# Patient Record
Sex: Female | Born: 1975
Health system: Southern US, Community
[De-identification: ages and names within clinical notes are randomized; demographics above are authoritative.]

## PROBLEM LIST (undated history)

## (undated) DIAGNOSIS — G43909 Migraine, unspecified, not intractable, without status migrainosus: Secondary | ICD-10-CM

## (undated) DIAGNOSIS — Z8619 Personal history of other infectious and parasitic diseases: Secondary | ICD-10-CM

## (undated) HISTORY — DX: Migraine, unspecified, not intractable, without status migrainosus: G43.909

## (undated) HISTORY — PX: NO PAST SURGERIES: SHX2092

## (undated) HISTORY — DX: Personal history of other infectious and parasitic diseases: Z86.19

## (undated) HISTORY — PX: AUGMENTATION MAMMAPLASTY: SUR837

---

## 1998-07-11 ENCOUNTER — Other Ambulatory Visit: Admission: RE | Admit: 1998-07-11 | Discharge: 1998-07-11 | Payer: Self-pay | Admitting: Obstetrics and Gynecology

## 1999-07-14 ENCOUNTER — Other Ambulatory Visit: Admission: RE | Admit: 1999-07-14 | Discharge: 1999-07-14 | Payer: Self-pay | Admitting: Obstetrics and Gynecology

## 2000-12-17 ENCOUNTER — Other Ambulatory Visit: Admission: RE | Admit: 2000-12-17 | Discharge: 2000-12-17 | Payer: Self-pay | Admitting: Obstetrics and Gynecology

## 2001-01-31 ENCOUNTER — Encounter: Payer: Self-pay | Admitting: Obstetrics and Gynecology

## 2001-01-31 ENCOUNTER — Ambulatory Visit (HOSPITAL_COMMUNITY): Admission: RE | Admit: 2001-01-31 | Discharge: 2001-01-31 | Payer: Self-pay | Admitting: Obstetrics and Gynecology

## 2001-05-31 ENCOUNTER — Inpatient Hospital Stay (HOSPITAL_COMMUNITY): Admission: AD | Admit: 2001-05-31 | Discharge: 2001-05-31 | Payer: Self-pay | Admitting: *Deleted

## 2001-06-18 ENCOUNTER — Inpatient Hospital Stay (HOSPITAL_COMMUNITY): Admission: AD | Admit: 2001-06-18 | Discharge: 2001-06-21 | Payer: Self-pay | Admitting: Obstetrics and Gynecology

## 2001-06-24 ENCOUNTER — Encounter: Admission: RE | Admit: 2001-06-24 | Discharge: 2001-07-24 | Payer: Self-pay | Admitting: Obstetrics and Gynecology

## 2001-07-25 ENCOUNTER — Encounter: Admission: RE | Admit: 2001-07-25 | Discharge: 2001-08-24 | Payer: Self-pay | Admitting: Obstetrics and Gynecology

## 2003-07-24 ENCOUNTER — Emergency Department (HOSPITAL_COMMUNITY): Admission: EM | Admit: 2003-07-24 | Discharge: 2003-07-24 | Payer: Self-pay | Admitting: Emergency Medicine

## 2004-06-29 ENCOUNTER — Other Ambulatory Visit: Admission: RE | Admit: 2004-06-29 | Discharge: 2004-06-29 | Payer: Self-pay | Admitting: Obstetrics and Gynecology

## 2005-05-22 ENCOUNTER — Ambulatory Visit: Payer: Self-pay | Admitting: Family Medicine

## 2005-07-11 ENCOUNTER — Other Ambulatory Visit: Admission: RE | Admit: 2005-07-11 | Discharge: 2005-07-11 | Payer: Self-pay | Admitting: Obstetrics and Gynecology

## 2006-07-10 ENCOUNTER — Ambulatory Visit: Payer: Self-pay | Admitting: Family Medicine

## 2008-06-23 ENCOUNTER — Ambulatory Visit: Payer: Self-pay | Admitting: Family Medicine

## 2009-05-17 ENCOUNTER — Telehealth (INDEPENDENT_AMBULATORY_CARE_PROVIDER_SITE_OTHER): Payer: Self-pay | Admitting: *Deleted

## 2009-10-06 ENCOUNTER — Ambulatory Visit: Payer: Self-pay | Admitting: Family Medicine

## 2009-10-06 DIAGNOSIS — J069 Acute upper respiratory infection, unspecified: Secondary | ICD-10-CM

## 2009-10-06 HISTORY — DX: Acute upper respiratory infection, unspecified: J06.9

## 2009-10-06 LAB — CONVERTED CEMR LAB: Rapid Strep: NEGATIVE

## 2009-12-30 ENCOUNTER — Telehealth (INDEPENDENT_AMBULATORY_CARE_PROVIDER_SITE_OTHER): Payer: Self-pay | Admitting: *Deleted

## 2009-12-30 ENCOUNTER — Ambulatory Visit: Payer: Self-pay | Admitting: Family Medicine

## 2009-12-30 DIAGNOSIS — R079 Chest pain, unspecified: Secondary | ICD-10-CM

## 2009-12-30 HISTORY — DX: Chest pain, unspecified: R07.9

## 2010-06-15 ENCOUNTER — Telehealth (INDEPENDENT_AMBULATORY_CARE_PROVIDER_SITE_OTHER): Payer: Self-pay | Admitting: *Deleted

## 2010-07-02 LAB — CONVERTED CEMR LAB
ALT: 13 units/L (ref 0–35)
AST: 17 units/L (ref 0–37)
Albumin: 4.2 g/dL (ref 3.5–5.2)
Alkaline Phosphatase: 55 units/L (ref 39–117)
BUN: 13 mg/dL (ref 6–23)
Basophils Absolute: 0 10*3/uL (ref 0.0–0.1)
Basophils Relative: 0.3 % (ref 0.0–3.0)
Bilirubin, Direct: 0.2 mg/dL (ref 0.0–0.3)
CK-MB: 0.7 ng/mL (ref 0.3–4.0)
CO2: 26 meq/L (ref 19–32)
Calcium: 9 mg/dL (ref 8.4–10.5)
Chloride: 103 meq/L (ref 96–112)
Cholesterol: 153 mg/dL (ref 0–200)
Creatinine, Ser: 0.7 mg/dL (ref 0.4–1.2)
Eosinophils Absolute: 0.1 10*3/uL (ref 0.0–0.7)
Eosinophils Relative: 0.8 % (ref 0.0–5.0)
GFR calc non Af Amer: 111.61 mL/min (ref >60)
Glucose, Bld: 75 mg/dL (ref 70–99)
HCT: 40.7 % (ref 36.0–46.0)
HDL: 44.8 mg/dL (ref >39.00)
Hemoglobin: 13.8 g/dL (ref 12.0–15.0)
LDL Cholesterol: 101 mg/dL — ABNORMAL HIGH (ref 0–99)
Lymphocytes Relative: 26 % (ref 12.0–46.0)
Lymphs Abs: 1.9 10*3/uL (ref 0.7–4.0)
MCHC: 33.9 g/dL (ref 30.0–36.0)
MCV: 94.6 fL (ref 78.0–100.0)
Monocytes Absolute: 0.5 10*3/uL (ref 0.1–1.0)
Monocytes Relative: 6.7 % (ref 3.0–12.0)
Neutro Abs: 4.9 10*3/uL (ref 1.4–7.7)
Neutrophils Relative %: 66.2 % (ref 43.0–77.0)
Platelets: 205 10*3/uL (ref 150.0–400.0)
Potassium: 4.2 meq/L (ref 3.5–5.1)
RBC: 4.3 M/uL (ref 3.87–5.11)
RDW: 13.8 % (ref 11.5–14.6)
Sed Rate: 7 mm/hr (ref 0–22)
Sodium: 134 meq/L — ABNORMAL LOW (ref 135–145)
TSH: 0.52 microintl units/mL (ref 0.35–5.50)
Total Bilirubin: 1.3 mg/dL — ABNORMAL HIGH (ref 0.3–1.2)
Total CHOL/HDL Ratio: 3
Total CK: 84 units/L (ref 7–177)
Total Protein: 7 g/dL (ref 6.0–8.3)
Triglycerides: 36 mg/dL (ref 0.0–149.0)
Troponin I: <0.01 ng/mL (ref <0.06)
VLDL: 7.2 mg/dL (ref 0.0–40.0)
WBC: 7.4 10*3/uL (ref 4.5–10.5)

## 2010-07-04 NOTE — Assessment & Plan Note (Signed)
Summary: CPX/FASTING//KN   Vital Signs:  Patient profile:   35 year old female Height:      63.50 inches Weight:      111 pounds BMI:     19.42 Pulse rate:   98 / minute BP sitting:   104 / 78  (left arm)  Vitals Entered By: Doristine Devoid CMA (December 30, 2009 10:20 AM) CC: CPX AND LABS- along w/ some joint pain    History of Present Illness: 35 yo woman here today for CPE.  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  chest pain- L sided, occured yesterday at work.  pain radiated up into neck and down L arm.  pain described as a dull ache.  intermittant, upper arm still aching.  no nausea.  + diaphoresis.  no family hx of heart disease.  recent increased stress- father dying of melanoma  Preventive Screening-Counseling & Management  Alcohol-Tobacco     Alcohol drinks/day: 0     Smoking Status: never  Caffeine-Diet-Exercise     Does Patient Exercise: yes     Type of exercise: is active w/ kids      Sexual History:  currently monogamous.        Drug Use:  never.    Current Medications (verified): 1)  Naproxen 500 Mg Tabs (Naproxen) .Marland Kitchen.. 1 Tab By Mouth Two Times A Day X7-10 Days and Then As Needed For Pain.  Take W/ Food. 2)  Cyclobenzaprine Hcl 10 Mg  Tabs (Cyclobenzaprine Hcl) .Marland Kitchen.. 1 By Mouth 2 Times Daily As Needed For Back Pain  Allergies (verified): 1)  ! Sulfa  Past History:  Past Medical History: none  Family History: Reviewed history from 06/23/2008 and no changes required. CAD-no HTN-no DM-no STROKE-no COLONCA- no BREAST CA-no metastatic melanoma- dad  Social History: married 2 children works at [employer] Status:  never Does Patient Exercise:  yes Sexual History:  currently monogamous Drug Use:  never  Review of Systems       The patient complains of anorexia and chest pain.  The patient denies fever, weight loss, weight gain, vision loss, decreased hearing, hoarseness, syncope, dyspnea on exertion, peripheral edema, prolonged cough, headaches, abdominal pain,  melena, hematochezia, severe indigestion/heartburn, hematuria, suspicious skin lesions, depression, abnormal bleeding, enlarged lymph nodes, and breast masses.         anorexia due to stress  Physical Exam  General:  Well-developed,well-nourished,in no acute distress; alert,appropriate and cooperative throughout examination Head:  Normocephalic and atraumatic without obvious abnormalities. No apparent alopecia or balding.  Eyes:  No corneal or conjunctival inflammation noted. EOMI. Perrla. Funduscopic exam benign, without hemorrhages, exudates or papilledema. Vision grossly normal. Ears:  External ear exam shows no significant lesions or deformities.  Otoscopic examination reveals clear canals, tympanic membranes are intact bilaterally without bulging, retraction, inflammation or discharge. Hearing is grossly normal bilaterally. Nose:  External nasal examination shows no deformity or inflammation. Nasal mucosa are pink and moist without lesions or exudates. Mouth:  Oral mucosa and oropharynx without lesions or exudates.  Teeth in good repair. Neck:  + TTP over trapezius and SCM, + muscle spasm Chest Wall:  + TTP over L upper anterior wall Breasts:  deferred to GYN Lungs:  Normal respiratory effort, chest expands symmetrically. Lungs are clear to auscultation, no crackles or wheezes. Heart:  Normal rate and regular rhythm. S1 and S2 normal without gallop, murmur, click, rub or other extra sounds. Abdomen:  Bowel sounds positive,abdomen soft and non-tender without masses, organomegaly or hernias noted. Genitalia:  deferred  to GYN Msk:  see neck exam full ROM of L shoulder Pulses:  +2 carotid, radial, DP Extremities:  No clubbing, cyanosis, edema, or deformity noted with normal full range of motion of all joints.   Neurologic:  No cranial nerve deficits noted. Station and gait are normal. Plantar reflexes are down-going bilaterally. DTRs are symmetrical throughout. Sensory, motor and coordinative  functions appear intact. Skin:  Intact without suspicious lesions or rashes Cervical Nodes:  No lymphadenopathy noted Axillary Nodes:  No palpable lymphadenopathy Inguinal Nodes:  No significant adenopathy Psych:  anxious   Impression & Recommendations:  Problem # 1:  PHYSICAL EXAMINATION (ICD-V70.0) Assessment New PE WNL w/ exception of chest and neck pain (see below).  EKG WNL  check labs.  UTD on GYN exam.  anticipatory guidance provided.  Problem # 2:  CHEST PAIN (ICD-786.50) Assessment: New pt's pain most likely due to muscle spasm.  start NSAIDs, muscle relaxer.  pain reproducible on exam.  EKG w/out acute abnormality.  check CK, CKMB, Troponin to r/o ischemia.  reviewed supportive care and red flags that should prompt return.  Pt expresses understanding and is in agreement w/ this plan. Orders: Venipuncture (71696) TLB-TSH (Thyroid Stimulating Hormone) (84443-TSH) TLB-CBC Platelet - w/Differential (85025-CBCD) TLB-BMP (Basic Metabolic Panel-BMET) (80048-METABOL) TLB-Lipid Panel (80061-LIPID) TLB-Hepatic/Liver Function Pnl (80076-HEPATIC) TLB-CK-MB (Creatine Kinase MB) (82553-CKMB) TLB-CK Total Only(Creatine Kinase/CPK) (82550-CK) T- * Misc. Laboratory test [phone]) Specimen Handling (10175)  Problem # 3:  ARTHRITIS, RHEUMATOID, FAMILY HX (ICD-V17.7) Assessment: New pt would like labs checked given strong family hx. Orders: TLB-Sedimentation Rate (ESR) (85652-ESR) Specimen Handling (10258)  Complete Medication List: 1)  Naproxen 500 Mg Tabs (Naproxen) .Marland Kitchen.. 1 tab by mouth two times a day x7-10 days and then as needed for pain.  take w/ food. 2)  Cyclobenzaprine Hcl 10 Mg Tabs (Cyclobenzaprine hcl) .Marland Kitchen.. 1 by mouth 2 times daily as needed for back pain  Other Orders: EKG w/ Interpretation (93000)  Patient Instructions: 1)  Follow up in 1 month to check on mood and stress 2)  We'll notify you of your lab results 3)  Take the Naproxen two times a day as directed- take w/  food to avoid upset stomach 4)  Use the cyclobenzaprine (muscle relaxer) at night- this will cause drowsiness 5)  Use a heating pad on your neck muscles 6)  Try and find an outlet for your stress 7)  Be aware of your posture 8)  Call with any questions or concerns 9)  Hang in there! Prescriptions: CYCLOBENZAPRINE HCL 10 MG  TABS (CYCLOBENZAPRINE HCL) 1 by mouth 2 times daily as needed for back pain  #20 x 0   Entered and Authorized by:   Neena Rhymes MD   Signed by:   Neena Rhymes MD on 12/30/2009   Method used:   Electronically to        [facility]* (retail)       [address] [place], [place]  [address]       Ph: [phone] or [phone]       Fax: [phone]   RxID:   [id number] NAPROXEN 500 MG TABS (NAPROXEN) 1 tab by mouth two times a day x7-10 days and then as needed for pain.  take w/ food.  #60 x 0   Entered and Authorized by:   Neena Rhymes MD   Signed by:   Neena Rhymes  MD on 12/30/2009   Method used:   Electronically to        [facility]* (retail)       [address] [place], [place]  [address]       Ph: [phone] or [phone]       Fax: [phone]   RxID:   [id number]   Appended Document: CPX/FASTING//KN    Clinical Lists Changes  Observations: Added new observation of PAP SMEAR: normal (08/02/2009 16:20)       Preventive Care Screening  Pap Smear:    Date:  08/02/2009    Results:  normal

## 2010-07-04 NOTE — Progress Notes (Signed)
Summary: labs  Phone Note Outgoing Call   Call placed by: Doristine Devoid CMA,  December 30, 2009 3:55 PM Call placed to: Patient Summary of Call: no evidence of heart damage- chest pain is not b/c of her heart.  everything looks great!   Follow-up for Phone Call        left message on machine ...........Marland KitchenDoristine Devoid CMA  December 30, 2009 3:55 PM   spoke w/ patient aware of labs........Marland KitchenDoristine Devoid CMA  January 03, 2010 4:52 PM

## 2010-07-04 NOTE — Assessment & Plan Note (Signed)
Summary: strep throat??//lch   Vital Signs:  Patient profile:   35 year old female Weight:      113 pounds Temp:     99.1 degrees F oral BP sitting:   106 / 70  (left arm)  Vitals Entered By: Doristine Devoid (Oct 06, 2009 9:04 AM) CC: ST, body aches and chills x2 days    History of Present Illness: 35 yo woman here today for ST, body aches, chills, HA.  + nasal congestion, ear ringing.  + cough productive of sputum.  has not taken temp.  sxs started 2 days ago.  no known sick contacts.  no hx of seasonal allergies.  Current Medications (verified): 1)  Yaz 3-0.02 Mg Tabs (Drospirenone-Ethinyl Estradiol) .... Take One Tablet Daily 2)  Fluticasone Propionate 50 Mcg/act  Susp (Fluticasone Propionate) .... 2 Sprays Each Nostril Once Daily  Allergies (verified): 1)  ! Sulfa  Review of Systems      See HPI  Physical Exam  General:  Well-developed,well-nourished,in no acute distress; alert,appropriate and cooperative throughout examination Head:  Normocephalic and atraumatic without obvious abnormalities. No apparent alopecia or balding.  no TTP over sinuses Eyes:  no injxn or inflammation Ears:  External ear exam shows no significant lesions or deformities.  Otoscopic examination reveals clear canals, tympanic membranes are intact bilaterally without bulging, retraction, inflammation or discharge. Hearing is grossly normal bilaterally. Nose:  External nasal examination shows no deformity or inflammation. Nasal mucosa are pink and moist without lesions or exudates. Mouth:  erythematous post pharynx but no tonsilar edema or exudates Lungs:  Normal respiratory effort, chest expands symmetrically. Lungs are clear to auscultation, no crackles or wheezes. Heart:  Normal rate and regular rhythm. S1 and S2 normal without gallop, murmur, click, rub or other extra sounds. Cervical Nodes:  R anterior LN tender and L anterior LN tender.     Impression & Recommendations:  Problem # 1:  URI  (ICD-465.9) Assessment New  strep (-).  pt's sxs consistent w/ viral illness- no bacterial infxn seen on exam.  reviewed supportive care and red flags that should prompt return.  Pt expresses understanding and is in agreement w/ this plan.  Orders: Rapid Strep (27062)  Complete Medication List: 1)  Yaz 3-0.02 Mg Tabs (Drospirenone-ethinyl estradiol) .... Take one tablet daily 2)  Fluticasone Propionate 50 Mcg/act Susp (Fluticasone propionate) .... 2 sprays each nostril once daily  Patient Instructions: 1)  This appears to be a virus and should improve with time 2)  Start Sudafed for your congestion and ear fullness 3)  Take the ibuprofen for your sore throat, fever 4)  Drink plenty of fluids 5)  If your symptoms worsen or change- please call 6)  Hang in there!!  Laboratory Results    Other Tests  Rapid Strep: negative  Kit Test Internal QC: Positive   (Normal Range: Negative)

## 2010-07-06 NOTE — Progress Notes (Signed)
Summary: Cyclobenzaprine refill  Phone Note Refill Request Call back at work = [phone] Message from:  Patient on June 15, 2010 9:08 AM  Refills Requested: Medication #1:  CYCLOBENZAPRINE HCL 10 MG  TABS 1 by mouth 2 times daily as needed for back pain.   Notes: needs refill---says back pain is back "with a vengence" !!! [facility],  [place], [place]       Patient couldnt remember name of medication needed;  when I told her name of this medication, she wasnt sure, but when I said directions stated for back pain, she agreed that this was the medication  Next Appointment Scheduled: none Initial call taken by: Jerolyn Shin,  June 15, 2010 9:09 AM    Prescriptions: CYCLOBENZAPRINE HCL 10 MG  TABS (CYCLOBENZAPRINE HCL) 1 by mouth 2 times daily as needed for back pain  #20 x 0   Entered by:   Doristine Devoid CMA   Authorized by:   Neena Rhymes MD   Signed by:   Doristine Devoid CMA on 06/16/2010   Method used:   Electronically to        [facility]* (retail)       [address] [place], [place]  [address]       Ph: [phone] or [phone]       Fax: [phone]   RxID:   [id number]

## 2010-10-20 NOTE — Discharge Summary (Signed)
[facility]  Patient:    Heather Woodard, Heather Woodard Visit Number: [id number] MRN: [id number]          Service Type: OBS Location: 910A [id number] [id number] Attending Physician:  Michaele Offer Dictated by:   Zenaida Niece, M.D. Admit Date:  06/18/2001 Discharge Date: 06/21/2001                             Discharge Summary  ADMISSION DIAGNOSES:          1. Intrauterine pregnancy at 37 weeks.                               2. Group B Strep carrier.  DISCHARGE DIAGNOSES:          1. Intrauterine pregnancy at 37 weeks.                               2. Group B Strep carrier.  PROCEDURES:                   Spontaneously vaginal delivery.  COMPLICATIONS:                None.  CONSULTATIONS:                None.  HISTORY AND PHYSICAL:         This is a 35 year old white female gravida 3, para 1-0-1-1 with an EGA of 37+ weeks by an LMP consistent with a 9-week ultrasound who presents with a complaint of regular contractions without bleeding or rupture of membranes and with good fetal movement.  Evaluation in triage revealed that her cervix changed from 3-4 cm to 4 cm.  Prenatal care was essentially uncomplicated.  PRENATAL LABORATORY DATA:     Blood type O+ with a negative antibody screen. RPR nonreactive, rubella immune, hepatitis B surface antigen negative, HIV negative.  Gonorrhea and chlamydia negative.  Triple screen normal.  One-hour Glucola 102.  Group B Strep is positive on a prior pregnancy.  PAST OBSTETRICAL HISTORY:     In 1998, a vaginal delivery at 37 weeks, 6 pounds 10 ounces, no complications except for positive Group B Strep. In 2000, spontaneous abortion.  ALLERGIES:                    SULFA.  PHYSICAL EXAMINATION:  VITAL SIGNS:                  She is afebrile with stable vital signs.  Fetal heart tracing reactive with slightly irregular contractions.  ABDOMEN:                      Gravid, nontender with an estimated fetal weight of  6-1/2 pounds.  Vaginal exam 4, 70, -1, 80, vertex presentation at an adequate pelvis and amniotomy reveals clear fluid.  HOSPITAL COURSE:              The patient was admitted and had amniotomy performed and was put on penicillin for group B Strep prophylaxis.  She continued to progress and received an epidural.  She progressed to complete and pushed well and on the morning of January 16 had a vaginal delivery of a viable female infant with Apgars of 9 and 9 that weighed 6 pounds 12 ounces over  a small second-degree laceration.  The placenta delivered spontaneously and was intact.  Her laceration was repaired with 3-0 Vicryl and estimated blood loss was less than 500 cc.  Postpartum, she did very well and breast fed her baby without complications, and on the morning of postpartum day #2 was felt to be stable enough for discharge home.  CONDITION ON DISCHARGE:       Stable.  DISPOSITION:                  Discharged to home.  DISCHARGE INSTRUCTIONS:       Her diet is a regular diet.  Her activity is pelvic rest.  Followup is in 4-6 weeks.  Her medications are Percocet p.r.n. pain and she is given our discharge pamphlet. Dictated by:   Zenaida Niece, M.D. Attending Physician:  Michaele Offer DD:  06/21/01 TD:  06/23/01 Job: [id number]

## 2013-05-01 LAB — HM PAP SMEAR: HM Pap smear: NEGATIVE

## 2013-06-01 ENCOUNTER — Telehealth: Payer: Self-pay

## 2013-06-01 NOTE — Telephone Encounter (Signed)
Medication and allergies:  Reviewed and updated  90 day supply/mail order: na Local pharmacy: [facility]   Immunizations due:  Declines flu vaccine  A/P:   Entered FH, PSH, personal Pap--11/20114 Tdap--doesn't remember last one  To Discuss with Provider: Neck and back pain Fasting labs

## 2013-06-03 ENCOUNTER — Encounter: Payer: Self-pay | Admitting: Family Medicine

## 2013-06-03 ENCOUNTER — Ambulatory Visit (INDEPENDENT_AMBULATORY_CARE_PROVIDER_SITE_OTHER): Payer: BC Managed Care – PPO | Admitting: Family Medicine

## 2013-06-03 VITALS — BP 122/82 | HR 77 | Temp 98.2°F | Resp 16 | Ht 64.0 in | Wt 119.0 lb

## 2013-06-03 DIAGNOSIS — Z0000 Encounter for general adult medical examination without abnormal findings: Secondary | ICD-10-CM | POA: Insufficient documentation

## 2013-06-03 DIAGNOSIS — M542 Cervicalgia: Secondary | ICD-10-CM | POA: Insufficient documentation

## 2013-06-03 HISTORY — DX: Encounter for general adult medical examination without abnormal findings: Z00.00

## 2013-06-03 HISTORY — DX: Cervicalgia: M54.2

## 2013-06-03 LAB — HEPATIC FUNCTION PANEL
ALT: 15 U/L (ref 0–35)
Bilirubin, Direct: 0.1 mg/dL (ref 0.0–0.3)
Total Bilirubin: 0.9 mg/dL (ref 0.3–1.2)

## 2013-06-03 LAB — CBC WITH DIFFERENTIAL/PLATELET
Basophils Absolute: 0 10*3/uL (ref 0.0–0.1)
Basophils Relative: 0.5 % (ref 0.0–3.0)
Eosinophils Absolute: 0.1 10*3/uL (ref 0.0–0.7)
Eosinophils Relative: 2.5 % (ref 0.0–5.0)
HCT: 40.6 % (ref 36.0–46.0)
Hemoglobin: 13.8 g/dL (ref 12.0–15.0)
Lymphocytes Relative: 31.6 % (ref 12.0–46.0)
Lymphs Abs: 1.7 10*3/uL (ref 0.7–4.0)
MCHC: 33.8 g/dL (ref 30.0–36.0)
MCV: 93.5 fl (ref 78.0–100.0)
Monocytes Absolute: 0.4 10*3/uL (ref 0.1–1.0)
Monocytes Relative: 6.9 % (ref 3.0–12.0)
Neutro Abs: 3.1 10*3/uL (ref 1.4–7.7)
Neutrophils Relative %: 58.5 % (ref 43.0–77.0)
Platelets: 205 10*3/uL (ref 150.0–400.0)
RBC: 4.34 Mil/uL (ref 3.87–5.11)
RDW: 13.5 % (ref 11.5–14.6)
WBC: 5.3 10*3/uL (ref 4.5–10.5)

## 2013-06-03 LAB — BASIC METABOLIC PANEL
BUN: 11 mg/dL (ref 6–23)
CO2: 27 mEq/L (ref 19–32)
Calcium: 9.1 mg/dL (ref 8.4–10.5)
Chloride: 104 mEq/L (ref 96–112)
Creatinine, Ser: 0.8 mg/dL (ref 0.4–1.2)
GFR: 89.96 mL/min (ref >60.00)
Glucose, Bld: 84 mg/dL (ref 70–99)
Potassium: 4.3 mEq/L (ref 3.5–5.1)
Sodium: 136 mEq/L (ref 135–145)

## 2013-06-03 LAB — LIPID PANEL
Cholesterol: 167 mg/dL (ref 0–200)
LDL Cholesterol: 105 mg/dL — ABNORMAL HIGH (ref 0–99)
Triglycerides: 37 mg/dL (ref 0.0–149.0)
VLDL: 7.4 mg/dL (ref 0.0–40.0)

## 2013-06-03 LAB — TSH: TSH: 0.79 u[IU]/mL (ref 0.35–5.50)

## 2013-06-03 MED ORDER — CYCLOBENZAPRINE HCL 10 MG PO TABS: 10.0000 mg | ORAL_TABLET | Freq: Three times a day (TID) | ORAL | Status: DC | PRN

## 2013-06-03 MED ORDER — NAPROXEN 500 MG PO TABS: 500.0000 mg | ORAL_TABLET | Freq: Two times a day (BID) | ORAL | Status: AC

## 2013-06-03 NOTE — Progress Notes (Signed)
   Subjective:    Patient ID: Heather Woodard, female    DOB: March 07, 1976, 37 y.o.   MRN: [id number]  HPI New to establish.  [facility]N- [facility]  CPE- UTD on pap  Neck pain- 1 yr ago went to chiropractic w/ a [employer].  Had xrays that showed 'disc issue'.  He 'twirled my neck around and cracked it' and it's hurt since.  Now it's 'unbearable'.  Pt has hx of muscle spasm.  Some question of arthritis in neck.  Limited mobility.   Review of Systems Patient reports no vision/ hearing changes, adenopathy,fever, weight change,  persistant/recurrent hoarseness , swallowing issues, chest pain, palpitations, edema, persistant/recurrent cough, hemoptysis, dyspnea (rest/exertional/paroxysmal nocturnal), gastrointestinal bleeding (melena, rectal bleeding), abdominal pain, significant heartburn, bowel changes, GU symptoms (dysuria, hematuria, incontinence), Gyn symptoms (abnormal  bleeding, pain),  syncope, focal weakness, memory loss, numbness & tingling, skin/hair/nail changes, abnormal bruising or bleeding, anxiety, or depression.     Objective:   Physical Exam General Appearance:    Alert, cooperative, no distress, appears stated age  Head:    Normocephalic, without obvious abnormality, atraumatic  Eyes:    PERRL, conjunctiva/corneas clear, EOM's intact, fundi    benign, both eyes  Ears:    Normal TM's and external ear canals, both ears  Nose:   Nares normal, septum midline, mucosa normal, no drainage    or sinus tenderness  Throat:   Lips, mucosa, and tongue normal; teeth and gums normal  Neck:   Symmetrical, trachea midline, no adenopathy.  Limited ROM   w/ rotation to R, + Spurling's   Thyroid: no enlargement/tenderness/nodules  Back:     Symmetric, no curvature, ROM normal, no CVA tenderness  Lungs:     Clear to auscultation bilaterally, respirations unlabored  Chest Wall:    No tenderness or deformity   Heart:    Regular rate and rhythm, S1 and S2 normal, no murmur, rub   or gallop  Breast Exam:     Deferred to GYN  Abdomen:     Soft, non-tender, bowel sounds active all four quadrants,    no masses, no organomegaly  Genitalia:    Deferred to GYN  Rectal:    Extremities:   Extremities normal, atraumatic, no cyanosis or edema  Pulses:   2+ and symmetric all extremities  Skin:   Skin color, texture, turgor normal, no rashes or lesions  Lymph nodes:   Cervical, supraclavicular, and axillary nodes normal  Neurologic:   CNII-XII intact, normal strength, sensation and reflexes    throughout          Assessment & Plan:

## 2013-06-03 NOTE — Assessment & Plan Note (Signed)
New.  Start scheduled NSAIDs and muscle relaxer.  Refer to ortho.  Reviewed supportive care and red flags that should prompt return.  Pt expressed understanding and is in agreement w/ plan.

## 2013-06-03 NOTE — Patient Instructions (Signed)
Follow up in 1 year or as needed We'll notify you of your lab results and make any changes if needed We'll call you with your ortho appt for the neck Start the Naproxen twice daily- take w/ food- for inflammation Start the Flexeril as needed for spasm- will cause drowsiness Call with any questions or concerns Happy New Year!

## 2013-06-03 NOTE — Assessment & Plan Note (Signed)
Pt's PE WNL w/ exception of neck pain.  UTD on GYN.  Check labs.  Anticipatory guidance provided.

## 2013-06-03 NOTE — Progress Notes (Signed)
Pre visit review using our clinic review tool, if applicable. No additional management support is needed unless otherwise documented below in the visit note. 

## 2013-06-07 LAB — VITAMIN D 1,25 DIHYDROXY
Vitamin D 1, 25 (OH)2 Total: 61 pg/mL (ref 18–72)
Vitamin D2 1, 25 (OH)2: <8 pg/mL
Vitamin D3 1, 25 (OH)2: 61 pg/mL

## 2013-12-28 ENCOUNTER — Telehealth: Payer: Self-pay | Admitting: Family Medicine

## 2013-12-28 DIAGNOSIS — I8390 Asymptomatic varicose veins of unspecified lower extremity: Secondary | ICD-10-CM

## 2013-12-28 NOTE — Telephone Encounter (Signed)
Ok for referral?

## 2013-12-28 NOTE — Telephone Encounter (Signed)
Referral placed. Pt notified.  

## 2013-12-28 NOTE — Telephone Encounter (Signed)
Pt wants a referral to see Dr. Earle GellBradham at [facility]  [phone].  She states she has varicose veins on her left leg.

## 2014-01-22 ENCOUNTER — Other Ambulatory Visit: Payer: Self-pay | Admitting: *Deleted

## 2014-01-22 DIAGNOSIS — I83893 Varicose veins of bilateral lower extremities with other complications: Secondary | ICD-10-CM

## 2014-02-12 ENCOUNTER — Encounter: Payer: Self-pay | Admitting: Surgery

## 2014-02-15 ENCOUNTER — Encounter: Payer: Self-pay | Admitting: Surgery

## 2014-02-15 ENCOUNTER — Ambulatory Visit (INDEPENDENT_AMBULATORY_CARE_PROVIDER_SITE_OTHER): Payer: BC Managed Care – PPO | Admitting: Surgery

## 2014-02-15 ENCOUNTER — Ambulatory Visit (HOSPITAL_COMMUNITY)
Admission: RE | Admit: 2014-02-15 | Discharge: 2014-02-15 | Disposition: A | Discharge status: Home / Self Care | Payer: BC Managed Care – PPO | Source: Ambulatory Visit | Attending: Surgery | Admitting: Surgery

## 2014-02-15 VITALS — BP 110/83 | HR 72 | Temp 98.5°F | Resp 14 | Ht 63.0 in | Wt 116.0 lb

## 2014-02-15 DIAGNOSIS — I83893 Varicose veins of bilateral lower extremities with other complications: Secondary | ICD-10-CM | POA: Insufficient documentation

## 2014-02-15 HISTORY — DX: Varicose veins of bilateral lower extremities with other complications: I83.893

## 2014-02-15 NOTE — Progress Notes (Signed)
Patient name: Heather Woodard MRN: [id number] DOB: [birth year] Sex: female   Referred by: Family member  Reason for referral:  Chief Complaint  Patient presents with  . Varicose Veins    BLE leg pain especially on the LLE.   No injury    HISTORY OF PRESENT ILLNESS: [name] who comes in today for evaluation of pain issues in her left leg.  She states that her legs have been getting progressively worse since her first child.  She described her left leg is feeling heavy almost all the time.  She does complain of swelling.  She states that she can always see her sock mark on her foot.  She has constant pain in her leg.  She does take ibuprofen with some relief.  She has never one compression stockings.  There is no history of DVT.  Past Medical History  Diagnosis Date  . History of chicken pox   . Migraines     Past Surgical History  Procedure Laterality Date  . No past surgeries      History   Social History  . Marital Status: Married    Spouse Name: N/A    Number of Children: N/A  . Years of Education: N/A   Occupational History  . Not on file.   Social History Main Topics  . Smoking status: Never Smoker   . Smokeless tobacco: Never Used  . Alcohol Use: No  . Drug Use: No  . Sexual Activity: Yes   Other Topics Concern  . Not on file   Social History Narrative  . No narrative on file    Family History  Problem Relation Age of Onset  . Cancer Father     Melanoma  . Rheum arthritis Paternal Aunt   . Emphysema Maternal Grandfather   . Alzheimer's disease Paternal Grandmother   . Varicose Veins Mother   . Varicose Veins Maternal Grandmother     Allergies as of 02/15/2014 - Review Complete 02/15/2014  Allergen Reaction Noted  . Sulfonamide derivatives  06/23/2008    Current Outpatient Prescriptions on File Prior to Visit  Medication Sig Dispense Refill  . cyclobenzaprine (FLEXERIL) 10 MG tablet Take 1 tablet (10 mg total) by  mouth 3 (three) times daily as needed for muscle spasms.  30 tablet  0  . naproxen (NAPROSYN) 500 MG tablet Take 1 tablet (500 mg total) by mouth 2 (two) times daily with a meal.  60 tablet  0   No current facility-administered medications on file prior to visit.     REVIEW OF SYSTEMS: Cardiovascular: No chest pain, chest pressure, palpitations, orthopnea, or dyspnea on exertion.  Positive for swelling, varicose veins and pain in feet when lying flat Pulmonary: No productive cough, asthma or wheezing. Neurologic: No weakness, paresthesias, aphasia, or amaurosis. No dizziness. Hematologic: No bleeding problems or clotting disorders. Musculoskeletal: No joint pain or joint swelling. Gastrointestinal: No blood in stool or hematemesis Genitourinary: No dysuria or hematuria. Psychiatric:: No history of major depression. Integumentary: No rashes or ulcers. Constitutional: No fever or chills.  PHYSICAL EXAMINATION: General: The patient appears their stated age.  Vital signs are BP 110/83  Pulse 72  Temp(Src) 98.5 F (36.9 C) (Oral)  Resp 14  Ht  (1.6 m)  Wt 116 lb (52.617 kg)  BMI 20.55 kg/m2  SpO2 99% HEENT:  No gross abnormalities Pulmonary: Respirations are non-labored Musculoskeletal: There are no major deformities.   Neurologic: No  focal weakness or paresthesias are detected, Skin: There are no ulcer or rashes noted. Psychiatric: The patient has normal affect. Cardiovascular: Left leg edema.  Varix noted going down the saphenous vein distribution  Diagnostic Studies: I have ordered and reviewed her venous insufficiency studies.  There is no evidence of deep vein reflux or obstruction.  There is reflux in the proximal left great saphenous vein as well as the anterior saphenous branch.  Maximum vein diameter is 0.5 cm.   Assessment:  Venous insufficiency, left leg Plan: The patient suffers from venous insufficiency in her left leg as evidenced by her vein study today  which is reflux in her proximal left greater saphenous vein as well as her left anterior saphenous branch.  She has varix going down the saphenous vein distribution in her left leg which are very painful for her and causing her some edema in the leg.  I have encouraged her to keep her leg elevated when possible and to continue taking ibuprofen for pain control.  She was fitted for 20-30 mm thigh-high compression stockings today.  She will come back in for a repeat evaluation in 3 months.  I believe she would be a good candidate for ablation of her great saphenous vein, and possibly her anterior saphenous vein however it does appear to be somewhat tortuous.  She may further benefit from stab phlebectomy of the symptomatic varicosities.     Jorge Ny, M.D. [facility] Office: [phone] Pager:  [phone]

## 2014-05-18 ENCOUNTER — Ambulatory Visit: Payer: BC Managed Care – PPO | Admitting: Vascular Surgery

## 2014-05-24 ENCOUNTER — Encounter: Payer: Self-pay | Admitting: Vascular Surgery

## 2014-05-25 ENCOUNTER — Encounter: Payer: Self-pay | Admitting: Vascular Surgery

## 2014-05-25 ENCOUNTER — Ambulatory Visit (INDEPENDENT_AMBULATORY_CARE_PROVIDER_SITE_OTHER): Payer: BC Managed Care – PPO | Admitting: Vascular Surgery

## 2014-05-25 VITALS — BP 112/75 | HR 99 | Ht 63.0 in | Wt 118.5 lb

## 2014-05-25 DIAGNOSIS — I83892 Varicose veins of left lower extremities with other complications: Secondary | ICD-10-CM

## 2014-05-25 NOTE — Progress Notes (Signed)
Subjective:     Patient ID: Heather Woodard, female   DOB: Aug 15, 1975, 38 y.o.   MRN: [id number]  HPI this 38 year old female returns today for further evaluation of her painful varicosities in the left leg which occurred following her last pregnancy 13 years ago. Since that time she has had increasingly painful bulging varicosities in the left anterior thigh extending lateral to the knee and the lateral pretibial region. She has mild edema as the day progresses. She has tried long-leg elastic compression stockings 20-30 millimeter gradient as well as elevation ibuprofen with no improvement in her symptoms. This is affecting her daily living and continuing to slowly worsen. She has no history of DVT.  Past Medical History  Diagnosis Date  . History of chicken pox   . Migraines     History  Substance Use Topics  . Smoking status: Never Smoker   . Smokeless tobacco: Never Used  . Alcohol Use: No    Family History  Problem Relation Age of Onset  . Cancer Father     Melanoma  . Rheum arthritis Paternal Aunt   . Emphysema Maternal Grandfather   . Alzheimer's disease Paternal Grandmother   . Varicose Veins Mother   . Varicose Veins Maternal Grandmother     Allergies  Allergen Reactions  . Sulfonamide Derivatives     Current outpatient prescriptions: cyclobenzaprine (FLEXERIL) 10 MG tablet, Take 1 tablet (10 mg total) by mouth 3 (three) times daily as needed for muscle spasms. (Patient not taking: Reported on 05/25/2014), Disp: 30 tablet, Rfl: 0;  naproxen (NAPROSYN) 500 MG tablet, Take 1 tablet (500 mg total) by mouth 2 (two) times daily with a meal. (Patient not taking: Reported on 05/25/2014), Disp: 60 tablet, Rfl: 0  BP 112/75 mmHg  Pulse 99  Ht 5\' 3"  (1.6 m)  Wt 118 lb 8 oz (53.751 kg)  BMI 21.00 kg/m2  SpO2 100%  Body mass index is 21 kg/(m^2).           Review of Systems denies chest pain, dyspnea on exertion, PND, orthopnea, hemoptysis     Objective:   Physical Exam BP 112/75 mmHg  Pulse 99  Ht 5\' 3"  (1.6 m)  Wt 118 lb 8 oz (53.751 kg)  BMI 21.00 kg/m2  SpO2 100%  Gen. well-developed well-nourished female no apparent distress alert and oriented 3 Lungs no rhonchi or wheezing Left leg with prominent bulging varicosities beginning in the upper third of the anterior thigh extending down the thigh and then lateral to the knee into the lateral calf anterior compartment and pretibial region with small areas of reticular and spider veins. No active ulcer or hyperpigmentation noted. 3+ dorsalis pedis pulse palpable.  This patient has documented gross reflux in the anterior saphenous branch and the great saphenous vein up to the saphenofemoral junction which is supplying this large cluster of painful varicosities. I performed an independent sono site exam at the bedside-B-mode ultrasound-and I feel that the vein is large enough for me to access to perform laser ablation left great saphenous vein and stab phlebectomy to relieve her symptoms     Assessment:     Painful varicosities left leg due to gross reflux left great saphenous system causing pain and swelling and affecting patient's daily living    Plan:     Patient needs #1 laser ablation left great saphenous vein plus greater than 20 stab phlebectomy followed by 2 courses of sclerotherapy We'll proceed with precertification to perform this in the near  future to help relieve this young lady's symptoms which are affecting her daily living

## 2014-06-22 ENCOUNTER — Other Ambulatory Visit: Payer: Self-pay | Admitting: *Deleted

## 2014-06-22 DIAGNOSIS — I83892 Varicose veins of left lower extremities with other complications: Secondary | ICD-10-CM

## 2014-07-23 ENCOUNTER — Encounter: Payer: Self-pay | Admitting: Vascular Surgery

## 2014-07-26 ENCOUNTER — Encounter: Payer: Self-pay | Admitting: Vascular Surgery

## 2014-07-26 ENCOUNTER — Ambulatory Visit (INDEPENDENT_AMBULATORY_CARE_PROVIDER_SITE_OTHER): Payer: BLUE CROSS/BLUE SHIELD | Admitting: Vascular Surgery

## 2014-07-26 VITALS — BP 113/83 | HR 74 | Resp 16 | Ht 63.0 in | Wt 120.0 lb

## 2014-07-26 DIAGNOSIS — I83892 Varicose veins of left lower extremities with other complications: Secondary | ICD-10-CM

## 2014-07-26 NOTE — Progress Notes (Signed)
Subjective:     Patient ID: Heather Woodard, female   DOB: [birth year], 39 y.o.   MRN: [id number]  HPI this 39 year old female had laser ablation of the left great saphenous vein from the mid to distal thigh to near the saphenofemoral junction plus approximately 30 stab phlebectomy of painful varicosities performed under local tumescent anesthesia. A total of 838 J of energy was utilized. She tolerated the procedure well.   Review of Systems     Objective:   Physical Exam BP 113/83 mmHg  Pulse 74  Resp 16  Ht 5\' 3"  (1.6 m)  Wt 120 lb (54.432 kg)  BMI 21.26 kg/m2       Assessment:     Well-tolerated laser ablation left great saphenous vein plus proximally 30 stab phlebectomy of painful varicosities performed under local tumescent anesthesia    Plan:     Return in one week for venous duplex exam to confirm closure left great saphenous vein Patient will then return later for sclerotherapy of smaller painful varicosities

## 2014-07-26 NOTE — Progress Notes (Signed)
   Laser Ablation Procedure    Date: 07/26/2014    Heather Woodard DOB:[birth year]  Consent signed: Yes  SurgeonJ.Marlynn Perking. Lawson  Procedure: Laser Ablation: left Greater Saphenous Vein  BP 113/83 mmHg  Pulse 74  Resp 16  Ht 5\' 3"  (1.6 m)  Wt 120 lb (54.432 kg)  BMI 21.26 kg/m2  Start:10:50    End time: 12p  Tumescent Anesthesia: 400 cc 0.9% NaCl with 50 cc Lidocaine HCL with 1% Epi and 15 cc 8.4% NaHCO3  Local Anesthesia: 6 cc Lidocaine HCL and NaHCO3 (ratio 2:1)  Pulsed mode: 15 watts, 500ms delay, 1.0 duration  Total energy: 838.3, Total pulses: 56, Total time: :56    Stab Phlebectomy: >20 Sites: Thigh and Calf  Patient tolerated procedure well: Yes  Notes:   Description of Procedure:  After marking the course of the secondary varicosities, the patient was placed on the operating table in the supine position, and the left leg was prepped and draped in sterile fashion.   Local anesthetic was administered and under ultrasound guidance the saphenous vein was accessed with a micro needle and guide wire; then the mirco puncture sheath was place.  A guide wire was inserted saphenofemoral junction , followed by a 5 french sheath.  The position of the sheath and then the laser fiber below the junction was confirmed using the ultrasound.  Tumescent anesthesia was administered along the course of the saphenous vein using ultrasound guidance. The patient was placed in Trendelenburg position and protective laser glasses were placed on patient and staff, and the laser was fired at 15 watt pulsed mode advancing 1-2 mm per sec for a total of 838.3 joules.   For stab phlebectomies, local anesthetic was administered at the previously marked varicosities, and tumescent anesthesia was administered around the vessels.  Greater than 20 stab wounds were made using the tip of an 11 blade. And using the vein hook, the phlebectomies were performed using a hemostat to avulse the varicosities.  Adequate  hemostasis was achieved.     Steri strips were applied to the stab wounds and ABD pads and thigh high compression stockings were applied.  Ace wrap bandages were applied over the phlebectomy sites and at the top of the saphenofemoral junction. Blood loss was less than 15 cc.  The patient ambulated out of the operating room having tolerated the procedure well.

## 2014-07-27 ENCOUNTER — Telehealth: Payer: Self-pay | Admitting: *Deleted

## 2014-07-27 NOTE — Telephone Encounter (Signed)
Pt is doing well. No bleeding or pain. Slept well. Following all instructions.

## 2014-07-30 ENCOUNTER — Encounter: Payer: Self-pay | Admitting: Vascular Surgery

## 2014-08-02 ENCOUNTER — Ambulatory Visit (INDEPENDENT_AMBULATORY_CARE_PROVIDER_SITE_OTHER): Payer: Self-pay | Admitting: Vascular Surgery

## 2014-08-02 ENCOUNTER — Ambulatory Visit (HOSPITAL_COMMUNITY)
Admission: RE | Admit: 2014-08-02 | Discharge: 2014-08-02 | Disposition: A | Discharge status: Home / Self Care | Payer: BLUE CROSS/BLUE SHIELD | Source: Ambulatory Visit | Attending: Vascular Surgery | Admitting: Vascular Surgery

## 2014-08-02 ENCOUNTER — Encounter: Payer: Self-pay | Admitting: Vascular Surgery

## 2014-08-02 VITALS — BP 108/74 | HR 76 | Resp 16 | Ht 63.0 in | Wt 120.0 lb

## 2014-08-02 DIAGNOSIS — I83892 Varicose veins of left lower extremities with other complications: Secondary | ICD-10-CM | POA: Insufficient documentation

## 2014-08-02 NOTE — Progress Notes (Signed)
Subjective:     Patient ID: Heather Woodard, female   DOB: 07-01-1975, 39 y.o.   MRN: [id number]  HPI this 39 year old female returns 1 week post laser ablation left great saphenous vein plus greater than 20 stab phlebectomy of painful varicosities performed under local tumescent anesthesia. She had moderate discomfort in the proximal thigh area near the inguinal crease. She did not take her ibuprofen as instructed only taking a few tablets. She said the pain was not severe. She has worn her long-leg elastic compression stocking as instructed. She has not noticed any distal edema. She's had no chest pain or shortness of breath. Review of Systems     Objective:   Physical Exam BP 108/74 mmHg  Pulse 76  Resp 16  Ht 5\' 3"  (1.6 m)  Wt 120 lb (54.432 kg)  BMI 21.26 kg/m2  Gen. well-developed well-nourished female in no apparent stress alert and oriented 3 Lungs no rhonchi or wheezing F leg with mild discomfort to palpation of great saphenous vein in proximal third of thigh. Moderate ecchymosis surrounding the stab phlebectomy sites in the anterior and lateral thigh and knee area. 3+ dorsalis pedis pulse palpable with no distal edema.  Today ordered a venous duplex exam the left leg which I reviewed and interpreted. There is no DVT. There is total closure of the great saphenous vein up to near the saphenofemoral junction.     Assessment:     Successful laser ablation left great saphenous vein plus greater than 20 stab phlebectomy of painful varicosities    Plan:     Patient to be scheduled for sclerotherapy to complete her treatment regimen

## 2014-09-01 ENCOUNTER — Ambulatory Visit: Payer: BLUE CROSS/BLUE SHIELD | Admitting: *Deleted

## 2014-09-14 ENCOUNTER — Encounter: Payer: Self-pay | Admitting: *Deleted

## 2014-09-15 ENCOUNTER — Ambulatory Visit (INDEPENDENT_AMBULATORY_CARE_PROVIDER_SITE_OTHER): Payer: BLUE CROSS/BLUE SHIELD | Admitting: *Deleted

## 2014-09-15 DIAGNOSIS — I83892 Varicose veins of left lower extremities with other complications: Secondary | ICD-10-CM

## 2014-09-15 DIAGNOSIS — I8393 Asymptomatic varicose veins of bilateral lower extremities: Secondary | ICD-10-CM

## 2014-09-15 NOTE — Progress Notes (Signed)
X=.3% Sotradecol administered with a 27g butterfly.  Patient received a total of 18cc.  Treated any spider veins and surface reticulars that I saw. Easy access and tol well. Will follow prn.  Photos: No.  Compression stockings applied: Yes.

## 2014-10-25 ENCOUNTER — Encounter: Payer: Self-pay | Admitting: *Deleted

## 2014-10-26 ENCOUNTER — Encounter: Payer: Self-pay | Admitting: *Deleted

## 2014-10-27 ENCOUNTER — Encounter: Payer: Self-pay | Admitting: Vascular Surgery

## 2014-10-27 ENCOUNTER — Ambulatory Visit (INDEPENDENT_AMBULATORY_CARE_PROVIDER_SITE_OTHER): Payer: BLUE CROSS/BLUE SHIELD | Admitting: *Deleted

## 2014-10-27 DIAGNOSIS — I83899 Varicose veins of unspecified lower extremities with other complications: Secondary | ICD-10-CM | POA: Diagnosis not present

## 2014-10-27 DIAGNOSIS — I83892 Varicose veins of left lower extremities with other complications: Secondary | ICD-10-CM

## 2014-10-27 NOTE — Progress Notes (Signed)
X=.3% Sotradecol administered with a 27g butterfly.  Patient received a total of 6cc.  Treated any remaining spider veins and a few surface reticulars that I saw. Healing well since last tx. Easy access. Tol well. Will follow prn.  Photos: No.  Compression stockings applied: Yes.

## 2017-09-23 ENCOUNTER — Ambulatory Visit: Payer: BLUE CROSS/BLUE SHIELD | Admitting: Physician Assistant

## 2017-09-23 ENCOUNTER — Encounter: Payer: Self-pay | Admitting: Physician Assistant

## 2017-09-23 ENCOUNTER — Other Ambulatory Visit: Payer: Self-pay

## 2017-09-23 VITALS — BP 104/68 | HR 80 | Temp 99.0°F | Resp 14 | Ht 64.0 in | Wt 119.0 lb

## 2017-09-23 DIAGNOSIS — B9689 Other specified bacterial agents as the cause of diseases classified elsewhere: Secondary | ICD-10-CM

## 2017-09-23 DIAGNOSIS — J302 Other seasonal allergic rhinitis: Secondary | ICD-10-CM

## 2017-09-23 DIAGNOSIS — J019 Acute sinusitis, unspecified: Secondary | ICD-10-CM

## 2017-09-23 MED ORDER — FLUTICASONE PROPIONATE 50 MCG/ACT NA SUSP: 2.0000 | Freq: Every day | NASAL | 0 refills | Status: DC

## 2017-09-23 MED ORDER — AMOXICILLIN 875 MG PO TABS: 875.0000 mg | ORAL_TABLET | Freq: Two times a day (BID) | ORAL | 0 refills | Status: DC

## 2017-09-23 NOTE — Patient Instructions (Signed)
Please take antibiotic as directed.  Increase fluid intake.  Use Saline nasal spray.  Take a daily multivitamin. Start the Flonase and get OTC (Claritin-D) to take daily.  Place a humidifier in the bedroom.  Please call or return clinic if symptoms are not improving.  Sinusitis Sinusitis is redness, soreness, and swelling (inflammation) of the paranasal sinuses. Paranasal sinuses are air pockets within the bones of your face (beneath the eyes, the middle of the forehead, or above the eyes). In healthy paranasal sinuses, mucus is able to drain out, and air is able to circulate through them by way of your nose. However, when your paranasal sinuses are inflamed, mucus and air can become trapped. This can allow bacteria and other germs to grow and cause infection. Sinusitis can develop quickly and last only a short time (acute) or continue over a long period (chronic). Sinusitis that lasts for more than 12 weeks is considered chronic.  CAUSES  Causes of sinusitis include:  Allergies.  Structural abnormalities, such as displacement of the cartilage that separates your nostrils (deviated septum), which can decrease the air flow through your nose and sinuses and affect sinus drainage.  Functional abnormalities, such as when the small hairs (cilia) that line your sinuses and help remove mucus do not work properly or are not present. SYMPTOMS  Symptoms of acute and chronic sinusitis are the same. The primary symptoms are pain and pressure around the affected sinuses. Other symptoms include:  Upper toothache.  Earache.  Headache.  Bad breath.  Decreased sense of smell and taste.  A cough, which worsens when you are lying flat.  Fatigue.  Fever.  Thick drainage from your nose, which often is green and may contain pus (purulent).  Swelling and warmth over the affected sinuses. DIAGNOSIS  Your caregiver will perform a physical exam. During the exam, your caregiver may:  Look in your nose for  signs of abnormal growths in your nostrils (nasal polyps).  Tap over the affected sinus to check for signs of infection.  View the inside of your sinuses (endoscopy) with a special imaging device with a light attached (endoscope), which is inserted into your sinuses. If your caregiver suspects that you have chronic sinusitis, one or more of the following tests may be recommended:  Allergy tests.  Nasal culture A sample of mucus is taken from your nose and sent to a lab and screened for bacteria.  Nasal cytology A sample of mucus is taken from your nose and examined by your caregiver to determine if your sinusitis is related to an allergy. TREATMENT  Most cases of acute sinusitis are related to a viral infection and will resolve on their own within 10 days. Sometimes medicines are prescribed to help relieve symptoms (pain medicine, decongestants, nasal steroid sprays, or saline sprays).  However, for sinusitis related to a bacterial infection, your caregiver will prescribe antibiotic medicines. These are medicines that will help kill the bacteria causing the infection.  Rarely, sinusitis is caused by a fungal infection. In theses cases, your caregiver will prescribe antifungal medicine. For some cases of chronic sinusitis, surgery is needed. Generally, these are cases in which sinusitis recurs more than 3 times per year, despite other treatments. HOME CARE INSTRUCTIONS   Drink plenty of water. Water helps thin the mucus so your sinuses can drain more easily.  Use a humidifier.  Inhale steam 3 to 4 times a day (for example, sit in the bathroom with the shower running).  Apply a warm, moist  washcloth to your face 3 to 4 times a day, or as directed by your caregiver.  Use saline nasal sprays to help moisten and clean your sinuses.  Take over-the-counter or prescription medicines for pain, discomfort, or fever only as directed by your caregiver. SEEK IMMEDIATE MEDICAL CARE IF:  You have  increasing pain or severe headaches.  You have nausea, vomiting, or drowsiness.  You have swelling around your face.  You have vision problems.  You have a stiff neck.  You have difficulty breathing. MAKE SURE YOU:   Understand these instructions.  Will watch your condition.  Will get help right away if you are not doing well or get worse. Document Released: 05/21/2005 Document Revised: 08/13/2011 Document Reviewed: 06/05/2011 [facility] Patient Information 2014 [place], [place].

## 2017-09-23 NOTE — Progress Notes (Signed)
Patient presents to clinic today c/o sinus pressure.pain, nasal congestion, post nasal drip with sore throat. Endorses low-grade fever since onset of symptoms. Denies tooth pain. Notes ear pressure. Denies chest congestion but notes continual dry cough. Endorses recent flights. Has taken Mucinex and Advil for symptom relief. Symptoms have been present for > 1 week with no improvement.   Past Medical History:  Diagnosis Date  . History of chicken pox   . Migraines     Current Outpatient Medications on File Prior to Visit  Medication Sig Dispense Refill  . levonorgestrel (MIRENA, 52 MG,) 20 MCG/24HR IUD Mirena 20 mcg/24 hours (5 yrs) 52 mg intrauterine device     No current facility-administered medications on file prior to visit.     Allergies  Allergen Reactions  . Sulfonamide Derivatives Rash    Family History  Problem Relation Age of Onset  . Cancer Father        Melanoma  . Rheum arthritis Paternal Aunt   . Emphysema Maternal Grandfather   . Alzheimer's disease Paternal Grandmother   . Varicose Veins Mother   . Varicose Veins Maternal Grandmother     Social History   Socioeconomic History  . Marital status: Married    Spouse name: Not on file  . Number of children: Not on file  . Years of education: Not on file  . Highest education level: Not on file  Occupational History  . Not on file  Social Needs  . Financial resource strain: Not on file  . Food insecurity:    Worry: Not on file    Inability: Not on file  . Transportation needs:    Medical: Not on file    Non-medical: Not on file  Tobacco Use  . Smoking status: Never Smoker  . Smokeless tobacco: Never Used  Substance and Sexual Activity  . Alcohol use: No    Alcohol/week: 0.0 oz  . Drug use: No  . Sexual activity: Yes  Lifestyle  . Physical activity:    Days per week: Not on file    Minutes per session: Not on file  . Stress: Not on file  Relationships  . Social connections:    Talks on  phone: Not on file    Gets together: Not on file    Attends religious service: Not on file    Active member of club or organization: Not on file    Attends meetings of clubs or organizations: Not on file    Relationship status: Not on file  Other Topics Concern  . Not on file  Social History Narrative  . Not on file   Review of Systems - See HPI.  All other ROS are negative.  BP 104/68   Pulse 80   Temp 99 F (37.2 C) (Oral)   Resp 14   Ht 5\' 4"  (1.626 m)   Wt 119 lb (54 kg)   SpO2 99%   BMI 20.43 kg/m   Physical Exam  Constitutional: She is oriented to person, place, and time. She appears well-developed and well-nourished.  HENT:  Head: Normocephalic and atraumatic.  Right Ear: Tympanic membrane normal.  Left Ear: Tympanic membrane normal.  Nose: Mucosal edema and rhinorrhea present.  Mouth/Throat: Uvula is midline, oropharynx is clear and moist and mucous membranes are normal.  Eyes: Conjunctivae are normal.  Neck: Neck supple. No thyromegaly present.  Cardiovascular: Normal rate, regular rhythm and normal heart sounds.  Pulmonary/Chest: Effort normal and breath sounds normal.  Neurological: She  is alert and oriented to person, place, and time.  Skin: Skin is warm. No rash noted.  Vitals reviewed.   Assessment/Plan: 1. Acute bacterial sinusitis Rx Amoxicillin..  Increase fluids.  Rest.  Saline nasal spray.  Probiotic.  Mucinex as directed.  Humidifier in bedroom. Flonase per orders.  Call or return to clinic if symptoms are not improving.  - amoxicillin (AMOXIL) 875 MG tablet; Take 1 tablet (875 mg total) by mouth 2 (two) times daily.  Dispense: 14 tablet; Refill: 0  2. Seasonal allergic rhinitis, unspecified trigger Start Claritin-D and Flonase. Supportive measures reviewed. - fluticasone (FLONASE) 50 MCG/ACT nasal spray; Place 2 sprays into both nostrils daily.  Dispense: 16 g; Refill: 0   Piedad ClimesWilliam Cody Lin Glazier, PA-C

## 2017-10-30 LAB — HM PAP SMEAR: HM Pap smear: NEGATIVE

## 2017-10-31 ENCOUNTER — Other Ambulatory Visit: Payer: Self-pay | Admitting: Gynecology

## 2017-10-31 DIAGNOSIS — R928 Other abnormal and inconclusive findings on diagnostic imaging of breast: Secondary | ICD-10-CM

## 2017-11-05 ENCOUNTER — Other Ambulatory Visit: Payer: Self-pay | Admitting: Gynecology

## 2017-11-05 ENCOUNTER — Ambulatory Visit: Payer: BLUE CROSS/BLUE SHIELD

## 2017-11-05 ENCOUNTER — Ambulatory Visit
Admission: RE | Admit: 2017-11-05 | Discharge: 2017-11-05 | Disposition: A | Discharge status: Home / Self Care | Payer: BLUE CROSS/BLUE SHIELD | Source: Ambulatory Visit | Attending: Gynecology | Admitting: Gynecology

## 2017-11-05 DIAGNOSIS — R928 Other abnormal and inconclusive findings on diagnostic imaging of breast: Secondary | ICD-10-CM

## 2017-11-05 DIAGNOSIS — R921 Mammographic calcification found on diagnostic imaging of breast: Secondary | ICD-10-CM

## 2017-11-05 HISTORY — PX: BREAST BIOPSY: SHX20

## 2017-11-07 ENCOUNTER — Ambulatory Visit
Admission: RE | Admit: 2017-11-07 | Discharge: 2017-11-07 | Disposition: A | Discharge status: Home / Self Care | Payer: BLUE CROSS/BLUE SHIELD | Source: Ambulatory Visit | Attending: Gynecology | Admitting: Gynecology

## 2017-11-07 ENCOUNTER — Other Ambulatory Visit: Payer: Self-pay | Admitting: Gynecology

## 2017-11-07 DIAGNOSIS — R921 Mammographic calcification found on diagnostic imaging of breast: Secondary | ICD-10-CM

## 2018-06-13 DIAGNOSIS — D485 Neoplasm of uncertain behavior of skin: Secondary | ICD-10-CM | POA: Diagnosis not present

## 2018-06-13 DIAGNOSIS — Z808 Family history of malignant neoplasm of other organs or systems: Secondary | ICD-10-CM | POA: Diagnosis not present

## 2018-06-13 DIAGNOSIS — Z85820 Personal history of malignant melanoma of skin: Secondary | ICD-10-CM | POA: Diagnosis not present

## 2018-06-13 DIAGNOSIS — D2262 Melanocytic nevi of left upper limb, including shoulder: Secondary | ICD-10-CM | POA: Diagnosis not present

## 2018-06-13 DIAGNOSIS — C4441 Basal cell carcinoma of skin of scalp and neck: Secondary | ICD-10-CM | POA: Diagnosis not present

## 2018-06-13 DIAGNOSIS — Z85828 Personal history of other malignant neoplasm of skin: Secondary | ICD-10-CM | POA: Diagnosis not present

## 2018-06-13 DIAGNOSIS — D225 Melanocytic nevi of trunk: Secondary | ICD-10-CM | POA: Diagnosis not present

## 2018-06-18 ENCOUNTER — Other Ambulatory Visit: Payer: Self-pay | Admitting: Gynecology

## 2018-06-18 DIAGNOSIS — N6019 Diffuse cystic mastopathy of unspecified breast: Secondary | ICD-10-CM

## 2018-06-24 ENCOUNTER — Ambulatory Visit
Admission: RE | Admit: 2018-06-24 | Discharge: 2018-06-24 | Disposition: A | Discharge status: Home / Self Care | Payer: BLUE CROSS/BLUE SHIELD | Source: Ambulatory Visit | Attending: Gynecology | Admitting: Gynecology

## 2018-06-24 ENCOUNTER — Other Ambulatory Visit: Payer: Self-pay | Admitting: Gynecology

## 2018-06-24 DIAGNOSIS — N6019 Diffuse cystic mastopathy of unspecified breast: Secondary | ICD-10-CM

## 2018-06-24 DIAGNOSIS — N6012 Diffuse cystic mastopathy of left breast: Secondary | ICD-10-CM | POA: Diagnosis not present

## 2018-06-24 DIAGNOSIS — R922 Inconclusive mammogram: Secondary | ICD-10-CM | POA: Diagnosis not present

## 2018-09-02 ENCOUNTER — Encounter: Payer: Self-pay | Admitting: Emergency Medicine

## 2018-10-06 DIAGNOSIS — L814 Other melanin hyperpigmentation: Secondary | ICD-10-CM | POA: Diagnosis not present

## 2018-10-06 DIAGNOSIS — D485 Neoplasm of uncertain behavior of skin: Secondary | ICD-10-CM | POA: Diagnosis not present

## 2018-10-06 DIAGNOSIS — D224 Melanocytic nevi of scalp and neck: Secondary | ICD-10-CM | POA: Diagnosis not present

## 2018-10-06 DIAGNOSIS — Z85828 Personal history of other malignant neoplasm of skin: Secondary | ICD-10-CM | POA: Diagnosis not present

## 2018-10-06 DIAGNOSIS — L905 Scar conditions and fibrosis of skin: Secondary | ICD-10-CM | POA: Diagnosis not present

## 2018-10-06 DIAGNOSIS — Z85820 Personal history of malignant melanoma of skin: Secondary | ICD-10-CM | POA: Diagnosis not present

## 2018-10-06 DIAGNOSIS — L821 Other seborrheic keratosis: Secondary | ICD-10-CM | POA: Diagnosis not present

## 2018-11-05 DIAGNOSIS — Z6821 Body mass index (BMI) 21.0-21.9, adult: Secondary | ICD-10-CM | POA: Diagnosis not present

## 2018-11-05 DIAGNOSIS — Z130 Encounter for screening for diseases of the blood and blood-forming organs and certain disorders involving the immune mechanism: Secondary | ICD-10-CM | POA: Diagnosis not present

## 2018-11-05 DIAGNOSIS — Z30015 Encounter for initial prescription of vaginal ring hormonal contraceptive: Secondary | ICD-10-CM | POA: Diagnosis not present

## 2018-11-05 DIAGNOSIS — Z30432 Encounter for removal of intrauterine contraceptive device: Secondary | ICD-10-CM | POA: Diagnosis not present

## 2018-11-05 DIAGNOSIS — Z01419 Encounter for gynecological examination (general) (routine) without abnormal findings: Secondary | ICD-10-CM | POA: Diagnosis not present

## 2018-11-07 DIAGNOSIS — L814 Other melanin hyperpigmentation: Secondary | ICD-10-CM | POA: Diagnosis not present

## 2018-11-07 DIAGNOSIS — Z85828 Personal history of other malignant neoplasm of skin: Secondary | ICD-10-CM | POA: Diagnosis not present

## 2018-11-07 DIAGNOSIS — Z85820 Personal history of malignant melanoma of skin: Secondary | ICD-10-CM | POA: Diagnosis not present

## 2018-11-07 DIAGNOSIS — D229 Melanocytic nevi, unspecified: Secondary | ICD-10-CM | POA: Diagnosis not present

## 2018-12-10 DIAGNOSIS — Z85828 Personal history of other malignant neoplasm of skin: Secondary | ICD-10-CM | POA: Diagnosis not present

## 2018-12-10 DIAGNOSIS — Z85820 Personal history of malignant melanoma of skin: Secondary | ICD-10-CM | POA: Diagnosis not present

## 2018-12-10 DIAGNOSIS — L821 Other seborrheic keratosis: Secondary | ICD-10-CM | POA: Diagnosis not present

## 2018-12-10 DIAGNOSIS — L814 Other melanin hyperpigmentation: Secondary | ICD-10-CM | POA: Diagnosis not present

## 2018-12-16 DIAGNOSIS — L905 Scar conditions and fibrosis of skin: Secondary | ICD-10-CM | POA: Diagnosis not present

## 2018-12-16 DIAGNOSIS — D485 Neoplasm of uncertain behavior of skin: Secondary | ICD-10-CM | POA: Diagnosis not present

## 2018-12-23 ENCOUNTER — Ambulatory Visit
Admission: RE | Admit: 2018-12-23 | Discharge: 2018-12-23 | Disposition: A | Discharge status: Home / Self Care | Payer: BC Managed Care – PPO | Source: Ambulatory Visit | Attending: Gynecology | Admitting: Gynecology

## 2018-12-23 ENCOUNTER — Ambulatory Visit
Admission: RE | Admit: 2018-12-23 | Discharge: 2018-12-23 | Disposition: A | Discharge status: Home / Self Care | Payer: BLUE CROSS/BLUE SHIELD | Source: Ambulatory Visit | Attending: Gynecology | Admitting: Gynecology

## 2018-12-23 ENCOUNTER — Other Ambulatory Visit: Payer: Self-pay

## 2018-12-23 DIAGNOSIS — N6019 Diffuse cystic mastopathy of unspecified breast: Secondary | ICD-10-CM

## 2018-12-23 DIAGNOSIS — N6321 Unspecified lump in the left breast, upper outer quadrant: Secondary | ICD-10-CM | POA: Diagnosis not present

## 2018-12-23 DIAGNOSIS — R922 Inconclusive mammogram: Secondary | ICD-10-CM | POA: Diagnosis not present

## 2019-01-22 ENCOUNTER — Ambulatory Visit (HOSPITAL_BASED_OUTPATIENT_CLINIC_OR_DEPARTMENT_OTHER): Admit: 2019-01-22 | Payer: BC Managed Care – PPO | Admitting: Obstetrics and Gynecology

## 2019-01-22 ENCOUNTER — Encounter (HOSPITAL_BASED_OUTPATIENT_CLINIC_OR_DEPARTMENT_OTHER): Payer: Self-pay

## 2019-01-22 SURGERY — SALPINGECTOMY, BILATERAL, LAPAROSCOPIC
Anesthesia: General | Laterality: Bilateral

## 2019-02-05 DIAGNOSIS — Z30015 Encounter for initial prescription of vaginal ring hormonal contraceptive: Secondary | ICD-10-CM | POA: Diagnosis not present

## 2019-02-05 DIAGNOSIS — Z3044 Encounter for surveillance of vaginal ring hormonal contraceptive device: Secondary | ICD-10-CM | POA: Diagnosis not present

## 2019-03-26 DIAGNOSIS — L814 Other melanin hyperpigmentation: Secondary | ICD-10-CM | POA: Diagnosis not present

## 2019-03-26 DIAGNOSIS — Z85820 Personal history of malignant melanoma of skin: Secondary | ICD-10-CM | POA: Diagnosis not present

## 2019-03-26 DIAGNOSIS — I781 Nevus, non-neoplastic: Secondary | ICD-10-CM | POA: Diagnosis not present

## 2019-03-26 DIAGNOSIS — L820 Inflamed seborrheic keratosis: Secondary | ICD-10-CM | POA: Diagnosis not present

## 2019-03-26 DIAGNOSIS — L821 Other seborrheic keratosis: Secondary | ICD-10-CM | POA: Diagnosis not present

## 2019-04-23 ENCOUNTER — Other Ambulatory Visit: Payer: Self-pay

## 2019-04-23 ENCOUNTER — Ambulatory Visit (INDEPENDENT_AMBULATORY_CARE_PROVIDER_SITE_OTHER): Payer: BC Managed Care – PPO | Admitting: Physician Assistant

## 2019-04-23 ENCOUNTER — Encounter: Payer: Self-pay | Admitting: Physician Assistant

## 2019-04-23 VITALS — BP 118/80 | HR 80 | Temp 98.4°F | Resp 14 | Ht 63.5 in | Wt 120.0 lb

## 2019-04-23 DIAGNOSIS — I493 Ventricular premature depolarization: Secondary | ICD-10-CM | POA: Diagnosis not present

## 2019-04-23 DIAGNOSIS — Z23 Encounter for immunization: Secondary | ICD-10-CM | POA: Diagnosis not present

## 2019-04-23 DIAGNOSIS — Z0000 Encounter for general adult medical examination without abnormal findings: Secondary | ICD-10-CM | POA: Diagnosis not present

## 2019-04-23 DIAGNOSIS — G43009 Migraine without aura, not intractable, without status migrainosus: Secondary | ICD-10-CM

## 2019-04-23 LAB — CBC WITH DIFFERENTIAL/PLATELET
Basophils Absolute: 0.1 10*3/uL (ref 0.0–0.1)
Basophils Relative: 1 % (ref 0.0–3.0)
Eosinophils Absolute: 0.1 10*3/uL (ref 0.0–0.7)
Eosinophils Relative: 1.1 % (ref 0.0–5.0)
HCT: 42.3 % (ref 36.0–46.0)
Hemoglobin: 14.1 g/dL (ref 12.0–15.0)
Lymphocytes Relative: 30.1 % (ref 12.0–46.0)
Lymphs Abs: 1.9 10*3/uL (ref 0.7–4.0)
MCHC: 33.3 g/dL (ref 30.0–36.0)
MCV: 95.5 fl (ref 78.0–100.0)
Monocytes Absolute: 0.4 10*3/uL (ref 0.1–1.0)
Monocytes Relative: 7 % (ref 3.0–12.0)
Neutro Abs: 3.8 10*3/uL (ref 1.4–7.7)
Neutrophils Relative %: 60.8 % (ref 43.0–77.0)
Platelets: 229 10*3/uL (ref 150.0–400.0)
RBC: 4.43 Mil/uL (ref 3.87–5.11)
RDW: 13 % (ref 11.5–15.5)
WBC: 6.3 10*3/uL (ref 4.0–10.5)

## 2019-04-23 LAB — COMPREHENSIVE METABOLIC PANEL
ALT: 22 U/L (ref 0–35)
AST: 20 U/L (ref 0–37)
Albumin: 4.5 g/dL (ref 3.5–5.2)
Alkaline Phosphatase: 52 U/L (ref 39–117)
BUN: 12 mg/dL (ref 6–23)
CO2: 26 mEq/L (ref 19–32)
Calcium: 9.4 mg/dL (ref 8.4–10.5)
Chloride: 103 mEq/L (ref 96–112)
Creatinine, Ser: 0.71 mg/dL (ref 0.40–1.20)
GFR: 90.16 mL/min (ref >60.00)
Glucose, Bld: 87 mg/dL (ref 70–99)
Potassium: 4.3 mEq/L (ref 3.5–5.1)
Sodium: 137 mEq/L (ref 135–145)
Total Bilirubin: 0.7 mg/dL (ref 0.2–1.2)
Total Protein: 7.3 g/dL (ref 6.0–8.3)

## 2019-04-23 LAB — LIPID PANEL
Cholesterol: 232 mg/dL — ABNORMAL HIGH (ref 0–200)
HDL: 59.8 mg/dL (ref >39.00)
LDL Cholesterol: 157 mg/dL — ABNORMAL HIGH (ref 0–99)
NonHDL: 172.36
Total CHOL/HDL Ratio: 4
Triglycerides: 77 mg/dL (ref 0.0–149.0)
VLDL: 15.4 mg/dL (ref 0.0–40.0)

## 2019-04-23 LAB — HEMOGLOBIN A1C: Hgb A1c MFr Bld: 5.5 % (ref 4.6–6.5)

## 2019-04-23 MED ORDER — SUMATRIPTAN SUCCINATE 25 MG PO TABS: 25.0000 mg | ORAL_TABLET | Freq: Once | ORAL | 0 refills | Status: DC

## 2019-04-23 NOTE — Progress Notes (Signed)
Patient presents to clinic today for annual exam.  Patient is fasting for labs.  Acute Concerns: Patient denies any true acute concerns today. On ROS she does mention having headaches, unilateral with photophobia and nausea, typically occurring monthly just before her menstrual period. Lasts about 3 days and resolves. Not responding to OTC medications. Cannot note any other triggers for this. Was recently started on OCP by her GYN.   Patient also endorses noting a skipped beat or two at nighttime while sitting quietly. Denies chest pain, racing heart, lightheadedness, dizziness, diaphoresis, SOB. Denies any known family history of CAD or arrhythmia.   Health Maintenance: Immunizations -- Agrees to TDaP today. Flu shot up-to-date.  Mammogram -- UTD PAP -- UTD  Past Medical History:  Diagnosis Date  . History of chicken pox   . Migraines     Past Surgical History:  Procedure Laterality Date  . AUGMENTATION MAMMAPLASTY    . BREAST BIOPSY Right 11/05/2017  . NO PAST SURGERIES      Current Outpatient Medications on File Prior to Visit  Medication Sig Dispense Refill  . Norethindrone-Ethinyl Estradiol-Fe Biphas (LO LOESTRIN FE) 1 MG-10 MCG / 10 MCG tablet Lo Loestrin Fe 1 mg-10 mcg (24)/10 mcg (2) tablet  TAKE 1 TABLET BY MOUTH ONCE DAILY     No current facility-administered medications on file prior to visit.     Allergies  Allergen Reactions  . Sulfonamide Derivatives Rash    Family History  Problem Relation Age of Onset  . Cancer Father        Melanoma  . Emphysema Maternal Grandfather   . Alzheimer's disease Paternal Grandmother   . Varicose Veins Mother   . Varicose Veins Maternal Grandmother   . Rheum arthritis Paternal Aunt     Social History   Socioeconomic History  . Marital status: Married    Spouse name: Not on file  . Number of children: Not on file  . Years of education: Not on file  . Highest education level: Not on file  Occupational History  .  Not on file  Social Needs  . Financial resource strain: Not on file  . Food insecurity    Worry: Not on file    Inability: Not on file  . Transportation needs    Medical: Not on file    Non-medical: Not on file  Tobacco Use  . Smoking status: Never Smoker  . Smokeless tobacco: Never Used  Substance and Sexual Activity  . Alcohol use: No    Alcohol/week: 0.0 standard drinks  . Drug use: No  . Sexual activity: Yes  Lifestyle  . Physical activity    Days per week: Not on file    Minutes per session: Not on file  . Stress: Not on file  Relationships  . Social Herbalist on phone: Not on file    Gets together: Not on file    Attends religious service: Not on file    Active member of club or organization: Not on file    Attends meetings of clubs or organizations: Not on file    Relationship status: Not on file  . Intimate partner violence    Fear of current or ex partner: Not on file    Emotionally abused: Not on file    Physically abused: Not on file    Forced sexual activity: Not on file  Other Topics Concern  . Not on file  Social History Narrative  . Not  on file   Review of Systems  Constitutional: Negative for fever and weight loss.  HENT: Negative for ear discharge, ear pain, hearing loss and tinnitus.   Eyes: Negative for blurred vision, double vision, photophobia and pain.  Respiratory: Negative for cough and shortness of breath.   Cardiovascular: Positive for palpitations. Negative for chest pain.  Gastrointestinal: Negative for abdominal pain, blood in stool, constipation, diarrhea, heartburn, melena, nausea and vomiting.  Genitourinary: Negative for dysuria, flank pain, frequency, hematuria and urgency.  Musculoskeletal: Negative for falls.  Neurological: Positive for headaches. Negative for dizziness and loss of consciousness.  Endo/Heme/Allergies: Negative for environmental allergies.  Psychiatric/Behavioral: Negative for depression, hallucinations,  substance abuse and suicidal ideas. The patient is not nervous/anxious and does not have insomnia.    Resp 14   Ht 5' 3.5" (1.613 m)   Wt 120 lb (54.4 kg)   BMI 20.92 kg/m   Physical Exam Vitals signs reviewed.  HENT:     Head: Normocephalic and atraumatic.     Right Ear: Tympanic membrane, ear canal and external ear normal.     Left Ear: Tympanic membrane, ear canal and external ear normal.     Nose: Nose normal. No mucosal edema.     Mouth/Throat:     Pharynx: Uvula midline. No oropharyngeal exudate or posterior oropharyngeal erythema.  Eyes:     Conjunctiva/sclera: Conjunctivae normal.     Pupils: Pupils are equal, round, and reactive to light.  Neck:     Musculoskeletal: Neck supple.     Thyroid: No thyromegaly.  Cardiovascular:     Rate and Rhythm: Normal rate and regular rhythm.     Heart sounds: Normal heart sounds.  Pulmonary:     Effort: Pulmonary effort is normal. No respiratory distress.     Breath sounds: Normal breath sounds. No wheezing or rales.  Abdominal:     General: Bowel sounds are normal. There is no distension.     Palpations: Abdomen is soft. There is no mass.     Tenderness: There is no abdominal tenderness. There is no guarding or rebound.  Lymphadenopathy:     Cervical: No cervical adenopathy.  Skin:    General: Skin is warm and dry.     Findings: No rash.  Neurological:     Mental Status: She is alert and oriented to person, place, and time.     Cranial Nerves: No cranial nerve deficit.    Assessment/Plan: 1. Visit for preventive health examination Depression screen negative. Health Maintenance reviewed. Preventive schedule discussed and handout given in AVS. Will obtain fasting labs today. - CBC w/Diff - Comp Met (CMET) - Hemoglobin A1c - Lipid Profile  2. Need for Tdap vaccination - Tdap vaccine greater than or equal to 7yo IM  3. Migraine without aura and without status migrainosus, not intractable Only occurs around menstrual  period, lasting 3 days. No aura noted. Recently started on OCPs. Discussed this may help over time as hormones settle. Will give Rx Imitrex 25 mg to have on hand for abortive therapy since OTC agents are subtherapeutic. Follow-up for any worsening or non-controlled symptoms.  - SUMAtriptan (IMITREX) 25 MG tablet; Take 1 tablet (25 mg total) by mouth once for 1 dose. May repeat in 2 hours if headache persists or recurs.  Dispense: 10 tablet; Refill: 0  4. PVC (premature ventricular contraction) Suspected based on history. Infrequent. Normotensive. No family history of CAD. She has not alarming symptoms that would raise concern for true arrhythmia or other  cardiac issue. EKG today with NSR. Will check labs today to include TSH and electrolytes. Will monitor. If worsening will proceed with further assessment (holter, etc). - EKG 12-Lead   Leeanne Rio, PA-C

## 2019-04-23 NOTE — Patient Instructions (Signed)
Please go to the lab for blood work.   Our office will call you with your results unless you have chosen to receive results via MyChart.  If your blood work is normal we will follow-up each year for physicals and as scheduled for chronic medical problems.  If anything is abnormal we will treat accordingly and get you in for a follow-up.  Please use the Imitex as directed if needed for migraine. Let us know if things are not improving with the OCP.  Let me know if anything worsens regarding the early beats you occasionally note at night. EKG looks good today. We will make sure electrolytes and thyroid function are stable.    Preventive Care 38-57 Years Old, Female Preventive care refers to visits with your health care provider and lifestyle choices that can promote health and wellness. This includes:  A yearly physical exam. This may also be called an annual well check.  Regular dental visits and eye exams.  Immunizations.  Screening for certain conditions.  Healthy lifestyle choices, such as eating a healthy diet, getting regular exercise, not using drugs or products that contain nicotine and tobacco, and limiting alcohol use. What can I expect for my preventive care visit? Physical exam Your health care provider will check your:  Height and weight. This may be used to calculate body mass index (BMI), which tells if you are at a healthy weight.  Heart rate and blood pressure.  Skin for abnormal spots. Counseling Your health care provider may ask you questions about your:  Alcohol, tobacco, and drug use.  Emotional well-being.  Home and relationship well-being.  Sexual activity.  Eating habits.  Work and work Statistician.  Method of birth control.  Menstrual cycle.  Pregnancy history. What immunizations do I need?  Influenza (flu) vaccine  This is recommended every year. Tetanus, diphtheria, and pertussis (Tdap) vaccine  You may need a Td booster every 10  years. Varicella (chickenpox) vaccine  You may need this if you have not been vaccinated. Zoster (shingles) vaccine  You may need this after age 6. Measles, mumps, and rubella (MMR) vaccine  You may need at least one dose of MMR if you were born in 1957 or later. You may also need a second dose. Pneumococcal conjugate (PCV13) vaccine  You may need this if you have certain conditions and were not previously vaccinated. Pneumococcal polysaccharide (PPSV23) vaccine  You may need one or two doses if you smoke cigarettes or if you have certain conditions. Meningococcal conjugate (MenACWY) vaccine  You may need this if you have certain conditions. Hepatitis A vaccine  You may need this if you have certain conditions or if you travel or work in places where you may be exposed to hepatitis A. Hepatitis B vaccine  You may need this if you have certain conditions or if you travel or work in places where you may be exposed to hepatitis B. Haemophilus influenzae type b (Hib) vaccine  You may need this if you have certain conditions. Human papillomavirus (HPV) vaccine  If recommended by your health care provider, you may need three doses over 6 months. You may receive vaccines as individual doses or as more than one vaccine together in one shot (combination vaccines). Talk with your health care provider about the risks and benefits of combination vaccines. What tests do I need? Blood tests  Lipid and cholesterol levels. These may be checked every 5 years, or more frequently if you are over 91 years old.  Hepatitis C test.  Hepatitis B test. Screening  Lung cancer screening. You may have this screening every year starting at age 70 if you have a 30-pack-year history of smoking and currently smoke or have quit within the past 15 years.  Colorectal cancer screening. All adults should have this screening starting at age 37 and continuing until age 49. Your health care provider may  recommend screening at age 39 if you are at increased risk. You will have tests every 1-10 years, depending on your results and the type of screening test.  Diabetes screening. This is done by checking your blood sugar (glucose) after you have not eaten for a while (fasting). You may have this done every 1-3 years.  Mammogram. This may be done every 1-2 years. Talk with your health care provider about when you should start having regular mammograms. This may depend on whether you have a family history of breast cancer.  BRCA-related cancer screening. This may be done if you have a family history of breast, ovarian, tubal, or peritoneal cancers.  Pelvic exam and Pap test. This may be done every 3 years starting at age 80. Starting at age 73, this may be done every 5 years if you have a Pap test in combination with an HPV test. Other tests  Sexually transmitted disease (STD) testing.  Bone density scan. This is done to screen for osteoporosis. You may have this scan if you are at high risk for osteoporosis. Follow these instructions at home: Eating and drinking  Eat a diet that includes fresh fruits and vegetables, whole grains, lean protein, and low-fat dairy.  Take vitamin and mineral supplements as recommended by your health care provider.  Do not drink alcohol if: ? Your health care provider tells you not to drink. ? You are pregnant, may be pregnant, or are planning to become pregnant.  If you drink alcohol: ? Limit how much you have to 0-1 drink a day. ? Be aware of how much alcohol is in your drink. In the [place] one drink equals one 12 oz bottle of beer (355 mL), one 5 oz glass of wine (148 mL), or one 1 oz glass of hard liquor (44 mL). Lifestyle  Take daily care of your teeth and gums.  Stay active. Exercise for at least 30 minutes on 5 or more days each week.  Do not use any products that contain nicotine or tobacco, such as cigarettes, e-cigarettes, and chewing tobacco. If  you need help quitting, ask your health care provider.  If you are sexually active, practice safe sex. Use a condom or other form of birth control (contraception) in order to prevent pregnancy and STIs (sexually transmitted infections).  If told by your health care provider, take low-dose aspirin daily starting at age 29. What's next?  Visit your health care provider once a year for a well check visit.  Ask your health care provider how often you should have your eyes and teeth checked.  Stay up to date on all vaccines. This information is not intended to replace advice given to you by your health care provider. Make sure you discuss any questions you have with your health care provider. Document Released: 06/17/2015 Document Revised: 01/30/2018 Document Reviewed: 01/30/2018 Elsevier Patient Education  2020 [employer].

## 2019-05-05 DIAGNOSIS — Z85820 Personal history of malignant melanoma of skin: Secondary | ICD-10-CM | POA: Diagnosis not present

## 2019-05-05 DIAGNOSIS — L814 Other melanin hyperpigmentation: Secondary | ICD-10-CM | POA: Diagnosis not present

## 2019-05-05 DIAGNOSIS — D229 Melanocytic nevi, unspecified: Secondary | ICD-10-CM | POA: Diagnosis not present

## 2019-05-05 DIAGNOSIS — Z85828 Personal history of other malignant neoplasm of skin: Secondary | ICD-10-CM | POA: Diagnosis not present

## 2019-08-04 DIAGNOSIS — M546 Pain in thoracic spine: Secondary | ICD-10-CM | POA: Diagnosis not present

## 2019-08-04 DIAGNOSIS — M542 Cervicalgia: Secondary | ICD-10-CM | POA: Diagnosis not present

## 2019-08-04 DIAGNOSIS — M9902 Segmental and somatic dysfunction of thoracic region: Secondary | ICD-10-CM | POA: Diagnosis not present

## 2019-08-04 DIAGNOSIS — M9901 Segmental and somatic dysfunction of cervical region: Secondary | ICD-10-CM | POA: Diagnosis not present

## 2019-08-06 DIAGNOSIS — M9901 Segmental and somatic dysfunction of cervical region: Secondary | ICD-10-CM | POA: Diagnosis not present

## 2019-08-06 DIAGNOSIS — M546 Pain in thoracic spine: Secondary | ICD-10-CM | POA: Diagnosis not present

## 2019-08-06 DIAGNOSIS — M542 Cervicalgia: Secondary | ICD-10-CM | POA: Diagnosis not present

## 2019-08-06 DIAGNOSIS — M9902 Segmental and somatic dysfunction of thoracic region: Secondary | ICD-10-CM | POA: Diagnosis not present

## 2019-10-27 DIAGNOSIS — D229 Melanocytic nevi, unspecified: Secondary | ICD-10-CM | POA: Diagnosis not present

## 2019-10-27 DIAGNOSIS — Z85828 Personal history of other malignant neoplasm of skin: Secondary | ICD-10-CM | POA: Insufficient documentation

## 2019-10-27 DIAGNOSIS — L814 Other melanin hyperpigmentation: Secondary | ICD-10-CM | POA: Diagnosis not present

## 2019-10-27 DIAGNOSIS — H00012 Hordeolum externum right lower eyelid: Secondary | ICD-10-CM | POA: Diagnosis not present

## 2019-10-27 HISTORY — DX: Personal history of other malignant neoplasm of skin: Z85.828

## 2019-12-17 ENCOUNTER — Other Ambulatory Visit: Payer: Self-pay | Admitting: Gynecology

## 2019-12-17 DIAGNOSIS — Z1231 Encounter for screening mammogram for malignant neoplasm of breast: Secondary | ICD-10-CM

## 2019-12-17 DIAGNOSIS — N6320 Unspecified lump in the left breast, unspecified quadrant: Secondary | ICD-10-CM

## 2019-12-30 ENCOUNTER — Other Ambulatory Visit: Payer: Self-pay | Admitting: Gynecology

## 2019-12-30 DIAGNOSIS — N6320 Unspecified lump in the left breast, unspecified quadrant: Secondary | ICD-10-CM

## 2020-01-01 ENCOUNTER — Ambulatory Visit
Admission: RE | Admit: 2020-01-01 | Discharge: 2020-01-01 | Disposition: A | Discharge status: Home / Self Care | Payer: BC Managed Care – PPO | Source: Ambulatory Visit | Attending: Gynecology | Admitting: Gynecology

## 2020-01-01 ENCOUNTER — Other Ambulatory Visit: Payer: Self-pay

## 2020-01-01 ENCOUNTER — Other Ambulatory Visit: Payer: Self-pay | Admitting: Gynecology

## 2020-01-01 DIAGNOSIS — R922 Inconclusive mammogram: Secondary | ICD-10-CM | POA: Diagnosis not present

## 2020-01-01 DIAGNOSIS — N6320 Unspecified lump in the left breast, unspecified quadrant: Secondary | ICD-10-CM

## 2020-01-01 DIAGNOSIS — N6321 Unspecified lump in the left breast, upper outer quadrant: Secondary | ICD-10-CM | POA: Diagnosis not present

## 2020-01-05 DIAGNOSIS — Z20822 Contact with and (suspected) exposure to covid-19: Secondary | ICD-10-CM | POA: Diagnosis not present

## 2020-04-18 DIAGNOSIS — N925 Other specified irregular menstruation: Secondary | ICD-10-CM | POA: Diagnosis not present

## 2020-04-25 ENCOUNTER — Ambulatory Visit (INDEPENDENT_AMBULATORY_CARE_PROVIDER_SITE_OTHER): Payer: BC Managed Care – PPO | Admitting: Physician Assistant

## 2020-04-25 ENCOUNTER — Other Ambulatory Visit: Payer: Self-pay

## 2020-04-25 ENCOUNTER — Encounter: Payer: Self-pay | Admitting: Physician Assistant

## 2020-04-25 VITALS — BP 100/70 | HR 77 | Temp 98.4°F | Resp 16 | Ht 63.5 in | Wt 110.0 lb

## 2020-04-25 DIAGNOSIS — Z0001 Encounter for general adult medical examination with abnormal findings: Secondary | ICD-10-CM | POA: Diagnosis not present

## 2020-04-25 DIAGNOSIS — F32A Depression, unspecified: Secondary | ICD-10-CM

## 2020-04-25 DIAGNOSIS — Z0000 Encounter for general adult medical examination without abnormal findings: Secondary | ICD-10-CM

## 2020-04-25 DIAGNOSIS — R002 Palpitations: Secondary | ICD-10-CM

## 2020-04-25 DIAGNOSIS — F419 Anxiety disorder, unspecified: Secondary | ICD-10-CM

## 2020-04-25 LAB — COMPREHENSIVE METABOLIC PANEL
ALT: 12 U/L (ref 0–35)
AST: 18 U/L (ref 0–37)
Albumin: 4.4 g/dL (ref 3.5–5.2)
Alkaline Phosphatase: 65 U/L (ref 39–117)
BUN: 13 mg/dL (ref 6–23)
CO2: 26 mEq/L (ref 19–32)
Calcium: 9.2 mg/dL (ref 8.4–10.5)
Chloride: 104 mEq/L (ref 96–112)
Creatinine, Ser: 0.67 mg/dL (ref 0.40–1.20)
GFR: 107.17 mL/min (ref >60.00)
Glucose, Bld: 87 mg/dL (ref 70–99)
Potassium: 4.1 mEq/L (ref 3.5–5.1)
Sodium: 138 mEq/L (ref 135–145)
Total Bilirubin: 0.5 mg/dL (ref 0.2–1.2)
Total Protein: 6.9 g/dL (ref 6.0–8.3)

## 2020-04-25 LAB — CBC WITH DIFFERENTIAL/PLATELET
Basophils Absolute: 0 10*3/uL (ref 0.0–0.1)
Basophils Relative: 0.7 % (ref 0.0–3.0)
Eosinophils Absolute: 0.1 10*3/uL (ref 0.0–0.7)
Eosinophils Relative: 1 % (ref 0.0–5.0)
HCT: 40.6 % (ref 36.0–46.0)
Hemoglobin: 13.7 g/dL (ref 12.0–15.0)
Lymphocytes Relative: 25.6 % (ref 12.0–46.0)
Lymphs Abs: 1.7 10*3/uL (ref 0.7–4.0)
MCHC: 33.7 g/dL (ref 30.0–36.0)
MCV: 94.2 fl (ref 78.0–100.0)
Monocytes Absolute: 0.4 10*3/uL (ref 0.1–1.0)
Monocytes Relative: 5.7 % (ref 3.0–12.0)
Neutro Abs: 4.4 10*3/uL (ref 1.4–7.7)
Neutrophils Relative %: 67 % (ref 43.0–77.0)
Platelets: 218 10*3/uL (ref 150.0–400.0)
RBC: 4.31 Mil/uL (ref 3.87–5.11)
RDW: 13.5 % (ref 11.5–15.5)
WBC: 6.5 10*3/uL (ref 4.0–10.5)

## 2020-04-25 LAB — LIPID PANEL
Cholesterol: 177 mg/dL (ref 0–200)
HDL: 63 mg/dL (ref >39.00)
LDL Cholesterol: 99 mg/dL (ref 0–99)
NonHDL: 113.88
Total CHOL/HDL Ratio: 3
Triglycerides: 75 mg/dL (ref 0.0–149.0)
VLDL: 15 mg/dL (ref 0.0–40.0)

## 2020-04-25 LAB — TSH: TSH: 0.95 u[IU]/mL (ref 0.35–4.50)

## 2020-04-25 MED ORDER — HYDROXYZINE HCL 10 MG PO TABS
10.0000 mg | ORAL_TABLET | Freq: Three times a day (TID) | ORAL | 0 refills | Status: DC | PRN
Start: 2020-04-25 — End: 2020-06-30

## 2020-04-25 NOTE — Progress Notes (Signed)
Patient presents to clinic today for annual exam.  Patient is fasting for labs.  Acute Concerns: Endorses increased level of anxiety since June of this year.  Notes she will be okay overall even though there is some level of generalized anxiety.  Notes every once in a while anxiety gets quite significant and resulted in racing heart and chest tightness.  Has also noted other episodes of racing heart when she is not feeling anxious.  Denies any chest pain, lightheadedness or dizziness.  Denies shortness of breath.  Patient does note some mild decreased mood and anhedonia. Notes decreased appetite with this as she does not eat when stressed.   Health Maintenance: Immunizations -- Declines flu shot. TDaP up-to-date.  Mammogram -- up-to-date. Followed by GYN. PAP -- up-to-date. Followed by GYN.   Past Medical History:  Diagnosis Date  . History of chicken pox   . Migraines     Past Surgical History:  Procedure Laterality Date  . AUGMENTATION MAMMAPLASTY    . BREAST BIOPSY Right 11/05/2017  . NO PAST SURGERIES      No current outpatient medications on file prior to visit.   No current facility-administered medications on file prior to visit.    Allergies  Allergen Reactions  . Sulfonamide Derivatives Rash    Family History  Problem Relation Age of Onset  . Cancer Father        Melanoma  . Emphysema Maternal Grandfather   . Alzheimer's disease Paternal Grandmother   . Varicose Veins Mother   . Varicose Veins Maternal Grandmother   . Rheum arthritis Paternal Aunt     Social History   Socioeconomic History  . Marital status: Married    Spouse name: Not on file  . Number of children: Not on file  . Years of education: Not on file  . Highest education level: Not on file  Occupational History  . Not on file  Tobacco Use  . Smoking status: Never Smoker  . Smokeless tobacco: Never Used  Substance and Sexual Activity  . Alcohol use: No    Alcohol/week: 0.0 standard  drinks  . Drug use: No  . Sexual activity: Yes  Other Topics Concern  . Not on file  Social History Narrative  . Not on file   Social Determinants of Health   Financial Resource Strain:   . Difficulty of Paying Living Expenses: Not on file  Food Insecurity:   . Worried About Programme researcher, broadcasting/film/video in the Last Year: Not on file  . Ran Out of Food in the Last Year: Not on file  Transportation Needs:   . Lack of Transportation (Medical): Not on file  . Lack of Transportation (Non-Medical): Not on file  Physical Activity:   . Days of Exercise per Week: Not on file  . Minutes of Exercise per Session: Not on file  Stress:   . Feeling of Stress : Not on file  Social Connections:   . Frequency of Communication with Friends and Family: Not on file  . Frequency of Social Gatherings with Friends and Family: Not on file  . Attends Religious Services: Not on file  . Active Member of Clubs or Organizations: Not on file  . Attends Banker Meetings: Not on file  . Marital Status: Not on file  Intimate Partner Violence:   . Fear of Current or Ex-Partner: Not on file  . Emotionally Abused: Not on file  . Physically Abused: Not on file  .  Sexually Abused: Not on file   Review of Systems  Constitutional: Negative for fever and weight loss.  HENT: Negative for ear discharge, ear pain, hearing loss and tinnitus.   Eyes: Negative for blurred vision, double vision, photophobia and pain.  Respiratory: Negative for cough and shortness of breath.   Cardiovascular: Negative for chest pain and palpitations.  Gastrointestinal: Negative for abdominal pain, blood in stool, constipation, diarrhea, heartburn, melena, nausea and vomiting.  Genitourinary: Negative for dysuria, flank pain, frequency, hematuria and urgency.  Musculoskeletal: Negative for falls.  Neurological: Negative for dizziness, loss of consciousness and headaches.  Endo/Heme/Allergies: Negative for environmental allergies.    Psychiatric/Behavioral: Negative for depression, hallucinations, substance abuse and suicidal ideas. The patient is not nervous/anxious and does not have insomnia.    BP 100/70   Pulse 77   Temp 98.4 F (36.9 C) (Temporal)   Resp 16   Ht 5' 3.5" (1.613 m)   Wt 110 lb (49.9 kg)   SpO2 99%   BMI 19.18 kg/m   Physical Exam Vitals reviewed.  HENT:     Head: Normocephalic and atraumatic.     Right Ear: Tympanic membrane, ear canal and external ear normal.     Left Ear: Tympanic membrane, ear canal and external ear normal.     Nose: Nose normal. No mucosal edema.     Mouth/Throat:     Pharynx: Uvula midline. No oropharyngeal exudate or posterior oropharyngeal erythema.  Eyes:     Conjunctiva/sclera: Conjunctivae normal.     Pupils: Pupils are equal, round, and reactive to light.  Neck:     Thyroid: No thyromegaly.  Cardiovascular:     Rate and Rhythm: Normal rate and regular rhythm.     Heart sounds: Normal heart sounds.  Pulmonary:     Effort: Pulmonary effort is normal. No respiratory distress.     Breath sounds: Normal breath sounds. No wheezing or rales.  Abdominal:     General: Bowel sounds are normal. There is no distension.     Palpations: Abdomen is soft. There is no mass.     Tenderness: There is no abdominal tenderness. There is no guarding or rebound.  Musculoskeletal:     Cervical back: Neck supple.  Lymphadenopathy:     Cervical: No cervical adenopathy.  Skin:    General: Skin is warm and dry.     Findings: No rash.  Neurological:     Mental Status: She is alert and oriented to person, place, and time.     Cranial Nerves: No cranial nerve deficit.    Assessment/Plan: 1. Visit for preventive health examination Depression screen negative. Health Maintenance reviewed. Preventive schedule discussed and handout given in AVS. Will obtain fasting labs today.  - CBC with Differential/Platelet - Comprehensive metabolic panel - Lipid panel  2. Anxiety and  depression Patient would like to avoid a daily medication if possible.  Discussed deep breathing exercises and mindfulness training.  She is going to start working on these at home.  Handouts for counseling services given.  She is to call and schedule an appointment.  Rx hydroxyzine to use more acute anxiety.  Follow-up in 4 to 6 weeks for reassessment.  We will also check TSH level today to make sure this is noncontributory. - TSH  3. Palpitations Sometimes associated with anxiety and other times not.  EKG today with normal sinus rhythm.  Short PR noted.  Will refer to cardiology for further evaluation. - EKG 12-Lead - Ambulatory referral to Cardiology  This visit occurred during the SARS-CoV-2 public health emergency.  Safety protocols were in place, including screening questions prior to the visit, additional usage of staff PPE, and extensive cleaning of exam room while observing appropriate contact time as indicated for disinfecting solutions.    Leeanne Rio, PA-C

## 2020-04-25 NOTE — Patient Instructions (Signed)
Please go to the lab for blood work.   Our office will call you with your results unless you have chosen to receive results via MyChart.  If your blood work is normal we will follow-up each year for physicals and as scheduled for chronic medical problems.  If anything is abnormal we will treat accordingly and get you in for a follow-up.  You will be contacted by Cardiology for further evaluation of symptoms and likely Holter placement. If you do not hear from them within 10 days, let me know.   Please use the handouts given to schedule counseling appointments.  Download the Cal[employer] or [employer] app as discussed to work on Paediatric nurse.  The hydroxyzine is to be used as directed if needed for acute anxiety. Let's follow-up in 6-8 weeks. Sooner if needed.    Preventive Care 67-22 Years Old, Female Preventive care refers to visits with your health care provider and lifestyle choices that can promote health and wellness. This includes:  A yearly physical exam. This may also be called an annual well check.  Regular dental visits and eye exams.  Immunizations.  Screening for certain conditions.  Healthy lifestyle choices, such as eating a healthy diet, getting regular exercise, not using drugs or products that contain nicotine and tobacco, and limiting alcohol use. What can I expect for my preventive care visit? Physical exam Your health care provider will check your:  Height and weight. This may be used to calculate body mass index (BMI), which tells if you are at a healthy weight.  Heart rate and blood pressure.  Skin for abnormal spots. Counseling Your health care provider may ask you questions about your:  Alcohol, tobacco, and drug use.  Emotional well-being.  Home and relationship well-being.  Sexual activity.  Eating habits.  Work and work Statistician.  Method of birth control.  Menstrual cycle.  Pregnancy history. What immunizations do I  need?  Influenza (flu) vaccine  This is recommended every year. Tetanus, diphtheria, and pertussis (Tdap) vaccine  You may need a Td booster every 10 years. Varicella (chickenpox) vaccine  You may need this if you have not been vaccinated. Zoster (shingles) vaccine  You may need this after age 39. Measles, mumps, and rubella (MMR) vaccine  You may need at least one dose of MMR if you were born in 1957 or later. You may also need a second dose. Pneumococcal conjugate (PCV13) vaccine  You may need this if you have certain conditions and were not previously vaccinated. Pneumococcal polysaccharide (PPSV23) vaccine  You may need one or two doses if you smoke cigarettes or if you have certain conditions. Meningococcal conjugate (MenACWY) vaccine  You may need this if you have certain conditions. Hepatitis A vaccine  You may need this if you have certain conditions or if you travel or work in places where you may be exposed to hepatitis A. Hepatitis B vaccine  You may need this if you have certain conditions or if you travel or work in places where you may be exposed to hepatitis B. Haemophilus influenzae type b (Hib) vaccine  You may need this if you have certain conditions. Human papillomavirus (HPV) vaccine  If recommended by your health care provider, you may need three doses over 6 months. You may receive vaccines as individual doses or as more than one vaccine together in one shot (combination vaccines). Talk with your health care provider about the risks and benefits of combination vaccines. What tests do I need? Blood  tests  Lipid and cholesterol levels. These may be checked every 5 years, or more frequently if you are over 34 years old.  Hepatitis C test.  Hepatitis B test. Screening  Lung cancer screening. You may have this screening every year starting at age 28 if you have a 30-pack-year history of smoking and currently smoke or have quit within the past 15  years.  Colorectal cancer screening. All adults should have this screening starting at age 64 and continuing until age 32. Your health care provider may recommend screening at age 15 if you are at increased risk. You will have tests every 1-10 years, depending on your results and the type of screening test.  Diabetes screening. This is done by checking your blood sugar (glucose) after you have not eaten for a while (fasting). You may have this done every 1-3 years.  Mammogram. This may be done every 1-2 years. Talk with your health care provider about when you should start having regular mammograms. This may depend on whether you have a family history of breast cancer.  BRCA-related cancer screening. This may be done if you have a family history of breast, ovarian, tubal, or peritoneal cancers.  Pelvic exam and Pap test. This may be done every 3 years starting at age 75. Starting at age 29, this may be done every 5 years if you have a Pap test in combination with an HPV test. Other tests  Sexually transmitted disease (STD) testing.  Bone density scan. This is done to screen for osteoporosis. You may have this scan if you are at high risk for osteoporosis. Follow these instructions at home: Eating and drinking  Eat a diet that includes fresh fruits and vegetables, whole grains, lean protein, and low-fat dairy.  Take vitamin and mineral supplements as recommended by your health care provider.  Do not drink alcohol if: ? Your health care provider tells you not to drink. ? You are pregnant, may be pregnant, or are planning to become pregnant.  If you drink alcohol: ? Limit how much you have to 0-1 drink a day. ? Be aware of how much alcohol is in your drink. In the [place] one drink equals one 12 oz bottle of beer (355 mL), one 5 oz glass of wine (148 mL), or one 1 oz glass of hard liquor (44 mL). Lifestyle  Take daily care of your teeth and gums.  Stay active. Exercise for at least 30  minutes on 5 or more days each week.  Do not use any products that contain nicotine or tobacco, such as cigarettes, e-cigarettes, and chewing tobacco. If you need help quitting, ask your health care provider.  If you are sexually active, practice safe sex. Use a condom or other form of birth control (contraception) in order to prevent pregnancy and STIs (sexually transmitted infections).  If told by your health care provider, take low-dose aspirin daily starting at age 54. What's next?  Visit your health care provider once a year for a well check visit.  Ask your health care provider how often you should have your eyes and teeth checked.  Stay up to date on all vaccines. This information is not intended to replace advice given to you by your health care provider. Make sure you discuss any questions you have with your health care provider. Document Revised: 01/30/2018 Document Reviewed: 01/30/2018 Elsevier Patient Education  2020 [employer].

## 2020-05-06 ENCOUNTER — Ambulatory Visit: Payer: BC Managed Care – PPO | Admitting: Cardiology

## 2020-05-06 DIAGNOSIS — G43909 Migraine, unspecified, not intractable, without status migrainosus: Secondary | ICD-10-CM | POA: Insufficient documentation

## 2020-05-06 DIAGNOSIS — Z8619 Personal history of other infectious and parasitic diseases: Secondary | ICD-10-CM | POA: Insufficient documentation

## 2020-05-09 ENCOUNTER — Ambulatory Visit (INDEPENDENT_AMBULATORY_CARE_PROVIDER_SITE_OTHER): Payer: BC Managed Care – PPO

## 2020-05-09 ENCOUNTER — Encounter: Payer: Self-pay | Admitting: Cardiology

## 2020-05-09 ENCOUNTER — Ambulatory Visit: Payer: BC Managed Care – PPO | Admitting: Cardiology

## 2020-05-09 ENCOUNTER — Other Ambulatory Visit: Payer: Self-pay

## 2020-05-09 DIAGNOSIS — R002 Palpitations: Secondary | ICD-10-CM

## 2020-05-09 DIAGNOSIS — R011 Cardiac murmur, unspecified: Secondary | ICD-10-CM

## 2020-05-09 HISTORY — DX: Cardiac murmur, unspecified: R01.1

## 2020-05-09 HISTORY — DX: Palpitations: R00.2

## 2020-05-09 NOTE — Progress Notes (Signed)
Cardiology Office Note:    Date:  05/09/2020   ID:  Heather Woodard, DOB 02/16/1976, MRN [id number]  PCP:  Waldon Merl, PA-C  Cardiologist:  Garwin Brothers, MD   Referring MD: Waldon Merl, PA-C    ASSESSMENT:    1. Palpitations   2. Cardiac murmur    PLAN:    In order of problems listed above:  1. Primary prevention stressed with the patient.  Importance of compliance with diet medication stressed and she vocalized understanding. 2. Palpitations: Again her TSH is fine.  In order to review this and evaluate it completely I will do a 2-week monitoring.  She knows to go to the nearest emergency room for any concerning symptoms.  Again she has not had any significant issues such as syncope or such. 3. Cardiac murmur: Echocardiogram will be done to assess murmur heard on auscultation. 4. Patient will be seen in follow-up appointment in 4 weeks or earlier if the patient has any concerns    Medication Adjustments/Labs and Tests Ordered: Current medicines are reviewed at length with the patient today.  Concerns regarding medicines are outlined above.  No orders of the defined types were placed in this encounter.  No orders of the defined types were placed in this encounter.    History of Present Illness:    Heather Woodard is a 44 y.o. female who is being seen today for the evaluation of palpitations at the request of Noel Journey.  Patient is a pleasant 44 year old female.  She has past medical history that is not much significant.  She mentions to me that in the past several weeks she has noticed palpitations.  No orthopnea PND no passing out spells.  His palpitations and skipped beat sensations are of concern to her.  For this reason she was evaluated.  I reviewed those notes.  EKG was reviewed.  It was unremarkable.  TSH also was within normal limits.  She denies any chest pain.  She is an active lady.  At the time of my evaluation, the patient  is alert awake oriented and in no distress.  Past Medical History:  Diagnosis Date  . CHEST PAIN 12/30/2009   Qualifier: Diagnosis of  By: Beverely Low MD, Natalia Leatherwood    . History of chicken pox   . History of skin cancer 10/27/2019   Formatting of this note might be different from the original. MIS on upper chest/supraclavicular, August 2019 [facility] on chest History of dysplatic nevi including one on the chest biopsied 2 months ago pending excision  . Migraines   . Neck pain on right side 06/03/2013  . Routine general medical examination at a health care facility 06/03/2013  . URI 10/06/2009   Qualifier: Diagnosis of  By: Beverely Low MD, Natalia Leatherwood    . Varicose veins of lower extremities with other complications 02/15/2014    Past Surgical History:  Procedure Laterality Date  . AUGMENTATION MAMMAPLASTY    . BREAST BIOPSY Right 11/05/2017  . NO PAST SURGERIES      Current Medications: Current Meds  Medication Sig  . hydrOXYzine (ATARAX/VISTARIL) 10 MG tablet Take 1 tablet (10 mg total) by mouth 3 (three) times daily as needed.     Allergies:   Sulfonamide derivatives   Social History   Socioeconomic History  . Marital status: Married    Spouse name: Not on file  . Number of children: Not on file  . Years of education: Not on file  .  Highest education level: Not on file  Occupational History  . Not on file  Tobacco Use  . Smoking status: Never Smoker  . Smokeless tobacco: Never Used  Substance and Sexual Activity  . Alcohol use: No    Alcohol/week: 0.0 standard drinks  . Drug use: No  . Sexual activity: Yes  Other Topics Concern  . Not on file  Social History Narrative  . Not on file   Social Determinants of Health   Financial Resource Strain:   . Difficulty of Paying Living Expenses: Not on file  Food Insecurity:   . Worried About Programme researcher, broadcasting/film/video in the Last Year: Not on file  . Ran Out of Food in the Last Year: Not on file  Transportation Needs:   . Lack of  Transportation (Medical): Not on file  . Lack of Transportation (Non-Medical): Not on file  Physical Activity:   . Days of Exercise per Week: Not on file  . Minutes of Exercise per Session: Not on file  Stress:   . Feeling of Stress : Not on file  Social Connections:   . Frequency of Communication with Friends and Family: Not on file  . Frequency of Social Gatherings with Friends and Family: Not on file  . Attends Religious Services: Not on file  . Active Member of Clubs or Organizations: Not on file  . Attends Banker Meetings: Not on file  . Marital Status: Not on file     Family History: The patient's family history includes Alzheimer's disease in her paternal grandmother; Cancer in her father; Emphysema in her maternal grandfather; Rheum arthritis in her paternal aunt; Varicose Veins in her maternal grandmother and mother.  ROS:   Please see the history of present illness.    All other systems reviewed and are negative.  EKGs/Labs/Other Studies Reviewed:    The following studies were reviewed today: EKG reveals sinus rhythm short PR and nonspecific ST-T changes.   Recent Labs: 04/25/2020: ALT 12; BUN 13; Creatinine, Ser 0.67; Hemoglobin 13.7; Platelets 218.0; Potassium 4.1; Sodium 138; TSH 0.95  Recent Lipid Panel    Component Value Date/Time   CHOL 177 04/25/2020 0956   TRIG 75.0 04/25/2020 0956   HDL 63.00 04/25/2020 0956   CHOLHDL 3 04/25/2020 0956   VLDL 15.0 04/25/2020 0956   LDLCALC 99 04/25/2020 0956    Physical Exam:    VS:  BP 116/78   Pulse 80   Ht 5\' 3"  (1.6 m)   Wt 112 lb (50.8 kg)   SpO2 99%   BMI 19.84 kg/m     Wt Readings from Last 3 Encounters:  05/09/20 112 lb (50.8 kg)  04/25/20 110 lb (49.9 kg)  04/23/19 120 lb (54.4 kg)     GEN: Patient is in no acute distress HEENT: Normal NECK: No JVD; No carotid bruits LYMPHATICS: No lymphadenopathy CARDIAC: S1 S2 regular, 2/6 systolic murmur at the apex. RESPIRATORY:  Clear to  auscultation without rales, wheezing or rhonchi  ABDOMEN: Soft, non-tender, non-distended MUSCULOSKELETAL:  No edema; No deformity  SKIN: Warm and dry NEUROLOGIC:  Alert and oriented x 3 PSYCHIATRIC:  Normal affect    Signed, 04/25/19, MD  05/09/2020 11:14 AM    [facility]

## 2020-05-09 NOTE — Patient Instructions (Addendum)
Medication Instructions:  No medication changes. *If you need a refill on your cardiac medications before your next appointment, please call your pharmacy*   Lab Work: None ordered If you have labs (blood work) drawn today and your tests are completely normal, you will receive your results only by: Marland Kitchen MyChart Message (if you have MyChart) OR . A paper copy in the mail If you have any lab test that is abnormal or we need to change your treatment, we will call you to review the results.   Testing/Procedures: Your physician has requested that you have an echocardiogram. Echocardiography is a painless test that uses sound waves to create images of your heart. It provides your doctor with information about the size and shape of your heart and how well your heart's chambers and valves are working. This procedure takes approximately one hour. There are no restrictions for this procedure.   WHY IS MY DOCTOR PRESCRIBING ZIO? The Zio system is proven and trusted by physicians to detect and diagnose irregular heart rhythms -- and has been prescribed to hundreds of thousands of patients.  The FDA has cleared the Zio system to monitor for many different kinds of irregular heart rhythms. In a study, physicians were able to reach a diagnosis 90% of the time with the Zio system1.  You can wear the Zio monitor -- a small, discreet, comfortable patch -- during your normal day-to-day activity, including while you sleep, shower, and exercise, while it records every single heartbeat for analysis.  1Barrett, P., et al. Comparison of 24 Hour Holter Monitoring Versus 14 Day Novel Adhesive Patch Electrocardiographic Monitoring. American Journal of Medicine, 2014.  ZIO VS. HOLTER MONITORING The Zio monitor can be comfortably worn for up to 14 days. Holter monitors can be worn for 24 to 48 hours, limiting the time to record any irregular heart rhythms you may have. Zio is able to capture data for the 51% of patients  who have their first symptom-triggered arrhythmia after 48 hours.1  LIVE WITHOUT RESTRICTIONS The Zio ambulatory cardiac monitor is a small, unobtrusive, and water-resistant patch--you might even forget you're wearing it. The Zio monitor records and stores every beat of your heart, whether you're sleeping, working out, or showering.  Wear the monitor for 14 days, remove 05/23/20.  Follow-Up: At [facility], you and your health needs are our priority.  As part of our continuing mission to provide you with exceptional heart care, we have created designated Provider Care Teams.  These Care Teams include your primary Cardiologist (physician) and Advanced Practice Providers (APPs -  Physician Assistants and Nurse Practitioners) who all work together to provide you with the care you need, when you need it.  We recommend signing up for the patient portal called "MyChart".  Sign up information is provided on this After Visit Summary.  MyChart is used to connect with patients for Virtual Visits (Telemedicine).  Patients are able to view lab/test results, encounter notes, upcoming appointments, etc.  Non-urgent messages can be sent to your provider as well.   To learn more about what you can do with MyChart, go to [email or web address].    Your next appointment:   1 month(s)  The format for your next appointment:   In Person  Provider:   Belva Crome, MD   Other Instructions  Echocardiogram An echocardiogram is a procedure that uses painless sound waves (ultrasound) to produce an image of the heart. Images from an echocardiogram can provide important information about:  Signs of coronary  artery disease (CAD).  Aneurysm detection. An aneurysm is a weak or damaged part of an artery wall that bulges out from the normal force of blood pumping through the body.  Heart size and shape. Changes in the size or shape of the heart can be associated with certain conditions, including heart failure,  aneurysm, and CAD.  Heart muscle function.  Heart valve function.  Signs of a past heart attack.  Fluid buildup around the heart.  Thickening of the heart muscle.  A tumor or infectious growth around the heart valves. Tell a health care provider about:  Any allergies you have.  All medicines you are taking, including vitamins, herbs, eye drops, creams, and over-the-counter medicines.  Any blood disorders you have.  Any surgeries you have had.  Any medical conditions you have.  Whether you are pregnant or may be pregnant. What are the risks? Generally, this is a safe procedure. However, problems may occur, including:  Allergic reaction to dye (contrast) that may be used during the procedure. What happens before the procedure? No specific preparation is needed. You may eat and drink normally. What happens during the procedure?   An IV tube may be inserted into one of your veins.  You may receive contrast through this tube. A contrast is an injection that improves the quality of the pictures from your heart.  A gel will be applied to your chest.  A wand-like tool (transducer) will be moved over your chest. The gel will help to transmit the sound waves from the transducer.  The sound waves will harmlessly bounce off of your heart to allow the heart images to be captured in real-time motion. The images will be recorded on a computer. The procedure may vary among health care providers and hospitals. What happens after the procedure?  You may return to your normal, everyday life, including diet, activities, and medicines, unless your health care provider tells you not to do that. Summary  An echocardiogram is a procedure that uses painless sound waves (ultrasound) to produce an image of the heart.  Images from an echocardiogram can provide important information about the size and shape of your heart, heart muscle function, heart valve function, and fluid buildup around  your heart.  You do not need to do anything to prepare before this procedure. You may eat and drink normally.  After the echocardiogram is completed, you may return to your normal, everyday life, unless your health care provider tells you not to do that. This information is not intended to replace advice given to you by your health care provider. Make sure you discuss any questions you have with your health care provider. Document Revised: 09/11/2018 Document Reviewed: 06/23/2016 Elsevier Patient Education  2020 [employer].

## 2020-05-10 ENCOUNTER — Ambulatory Visit: Payer: BC Managed Care – PPO | Admitting: Cardiology

## 2020-05-23 ENCOUNTER — Ambulatory Visit (HOSPITAL_COMMUNITY)
Admission: RE | Admit: 2020-05-23 | Discharge: 2020-05-23 | Disposition: A | Discharge status: Home / Self Care | Payer: BC Managed Care – PPO | Source: Ambulatory Visit | Attending: Cardiology | Admitting: Cardiology

## 2020-05-23 ENCOUNTER — Other Ambulatory Visit: Payer: Self-pay

## 2020-05-23 DIAGNOSIS — R002 Palpitations: Secondary | ICD-10-CM | POA: Insufficient documentation

## 2020-05-23 DIAGNOSIS — R011 Cardiac murmur, unspecified: Secondary | ICD-10-CM | POA: Diagnosis not present

## 2020-05-23 DIAGNOSIS — R001 Bradycardia, unspecified: Secondary | ICD-10-CM | POA: Insufficient documentation

## 2020-05-23 LAB — ECHOCARDIOGRAM COMPLETE
AR max vel: 2.85 cm2
AV Area VTI: 2.63 cm2
AV Area mean vel: 2.37 cm2
AV Mean grad: 2 mmHg
AV Peak grad: 3.1 mmHg
Ao pk vel: 0.89 m/s
Area-P 1/2: 2.69 cm2
S' Lateral: 2.5 cm

## 2020-05-23 NOTE — Progress Notes (Signed)
  Echocardiogram 2D Echocardiogram has been performed.  Heather Woodard 05/23/2020, 10:42 AM

## 2020-05-31 DIAGNOSIS — R002 Palpitations: Secondary | ICD-10-CM | POA: Diagnosis not present

## 2020-06-07 DIAGNOSIS — Z85820 Personal history of malignant melanoma of skin: Secondary | ICD-10-CM | POA: Diagnosis not present

## 2020-06-07 DIAGNOSIS — L814 Other melanin hyperpigmentation: Secondary | ICD-10-CM | POA: Diagnosis not present

## 2020-06-07 DIAGNOSIS — D229 Melanocytic nevi, unspecified: Secondary | ICD-10-CM | POA: Diagnosis not present

## 2020-06-07 DIAGNOSIS — Z85828 Personal history of other malignant neoplasm of skin: Secondary | ICD-10-CM | POA: Diagnosis not present

## 2020-06-30 ENCOUNTER — Encounter: Payer: Self-pay | Admitting: Cardiology

## 2020-06-30 ENCOUNTER — Ambulatory Visit: Payer: BC Managed Care – PPO | Admitting: Cardiology

## 2020-06-30 ENCOUNTER — Other Ambulatory Visit: Payer: Self-pay

## 2020-06-30 VITALS — BP 120/70 | HR 72 | Ht 63.0 in | Wt 111.1 lb

## 2020-06-30 DIAGNOSIS — R011 Cardiac murmur, unspecified: Secondary | ICD-10-CM

## 2020-06-30 DIAGNOSIS — R002 Palpitations: Secondary | ICD-10-CM

## 2020-06-30 NOTE — Progress Notes (Signed)
Cardiology Office Note:    Date:  06/30/2020   ID:  Heather Woodard, DOB 02/12/1976, MRN [id number]  PCP:  Waldon Merl, PA-C  Cardiologist:  Garwin Brothers, MD   Referring MD: Waldon Merl, PA-C    ASSESSMENT:    1. Palpitations   2. Cardiac murmur    PLAN:    In order of problems listed above:  1. Primary prevention stressed with the patient.  Importance of compliance with diet medication stressed and she vocalized understanding.  I told her to walk at least half an hour a day and she promises to do this regularly. 2. Palpitations: These have resolved significantly and she is happy about it.  I reviewed findings of monitoring with her. 3. Cardiac murmur: Benign.  Echo report discussed with the patient at length. 4. History of dyslipidemia: Her lipids are better in the last evaluation was discussed with her and she was happy about it. 5. Patient will be seen in follow-up appointment in 12 months or earlier if the patient has any concerns    Medication Adjustments/Labs and Tests Ordered: Current medicines are reviewed at length with the patient today.  Concerns regarding medicines are outlined above.  No orders of the defined types were placed in this encounter.  No orders of the defined types were placed in this encounter.    No chief complaint on file.    History of Present Illness:    Heather Woodard is a 45 y.o. female.  Patient has past medical history of palpitations.  She was evaluated by monitoring. She mentions to me that she had issues at home which was stressful.  Then getting to be some better.  She is also being able to handle it better.  Her palpitations have resolved significantly.  At the time of my evaluation, the patient is alert awake oriented and in no distress.  Past Medical History:  Diagnosis Date  . Cardiac murmur 05/09/2020  . CHEST PAIN 12/30/2009   Qualifier: Diagnosis of  By: Beverely Low MD, Natalia Leatherwood    . History of  chicken pox   . History of skin cancer 10/27/2019   Formatting of this note might be different from the original. MIS on upper chest/supraclavicular, August 2019 [facility] on chest History of dysplatic nevi including one on the chest biopsied 2 months ago pending excision  . Migraines   . Neck pain on right side 06/03/2013  . Palpitations 05/09/2020  . Routine general medical examination at a health care facility 06/03/2013  . URI 10/06/2009   Qualifier: Diagnosis of  By: Beverely Low MD, Natalia Leatherwood    . Varicose veins of lower extremities with other complications 02/15/2014    Past Surgical History:  Procedure Laterality Date  . AUGMENTATION MAMMAPLASTY    . BREAST BIOPSY Right 11/05/2017  . NO PAST SURGERIES      Current Medications: No outpatient medications have been marked as taking for the 06/30/20 encounter (Office Visit) with Yarieliz Wasser, Aundra Dubin, MD.     Allergies:   Sulfonamide derivatives   Social History   Socioeconomic History  . Marital status: Married    Spouse name: Not on file  . Number of children: Not on file  . Years of education: Not on file  . Highest education level: Not on file  Occupational History  . Not on file  Tobacco Use  . Smoking status: Never Smoker  . Smokeless tobacco: Never Used  Substance and Sexual Activity  . Alcohol use: No  Alcohol/week: 0.0 standard drinks  . Drug use: No  . Sexual activity: Yes  Other Topics Concern  . Not on file  Social History Narrative  . Not on file   Social Determinants of Health   Financial Resource Strain: Not on file  Food Insecurity: Not on file  Transportation Needs: Not on file  Physical Activity: Not on file  Stress: Not on file  Social Connections: Not on file     Family History: The patient's family history includes Alzheimer's disease in her paternal grandmother; Cancer in her father; Emphysema in her maternal grandfather; Rheum arthritis in her paternal aunt; Varicose Veins in her maternal grandmother  and mother.  ROS:   Please see the history of present illness.    All other systems reviewed and are negative.  EKGs/Labs/Other Studies Reviewed:    The following studies were reviewed today: IMPRESSIONS    1. Left ventricular ejection fraction, by estimation, is 60 to 65%. The  left ventricle has normal function. The left ventricle has no regional  wall motion abnormalities. Left ventricular diastolic parameters were  normal.  2. Right ventricular systolic function is normal. The right ventricular  size is normal. There is normal pulmonary artery systolic pressure.  3. The mitral valve is normal in structure. Trivial mitral valve  regurgitation.  4. The aortic valve is tricuspid. Aortic valve regurgitation is not  visualized.  5. The inferior vena cava is normal in size with <50% respiratory  variability, suggesting right atrial pressure of 8 mmHg.   EVENT MONITOR REPORT:   Patient was monitored from 05/09/2020 to 05/23/2020. Indication:                    Palpitations Ordering physician:  Garwin Brothers, MD  Referring physician:        Garwin Brothers, MD    Baseline rhythm: Sinus  Minimum heart rate: 49 BPM.  Average heart rate: 82 BPM.  Maximal heart rate 163 BPM.  Atrial arrhythmia: None significant.  Rare brief atrial run.  Ventricular arrhythmia: None significant.  Rare PVCs  Conduction abnormality: None significant  Symptoms: None significant   Conclusion:  Uneventful event monitoring.  Mildly abnormal.  Interpreting  cardiologist: Garwin Brothers, MD  Date: 06/13/2020 11:50 AM     Recent Labs: 04/25/2020: ALT 12; BUN 13; Creatinine, Ser 0.67; Hemoglobin 13.7; Platelets 218.0; Potassium 4.1; Sodium 138; TSH 0.95  Recent Lipid Panel    Component Value Date/Time   CHOL 177 04/25/2020 0956   TRIG 75.0 04/25/2020 0956   HDL 63.00 04/25/2020 0956   CHOLHDL 3 04/25/2020 0956   VLDL 15.0 04/25/2020 0956   LDLCALC 99 04/25/2020  0956    Physical Exam:    VS:  BP 120/70   Pulse 72   Ht 5\' 3"  (1.6 m)   Wt 111 lb 1.9 oz (50.4 kg)   SpO2 100%   BMI 19.68 kg/m     Wt Readings from Last 3 Encounters:  06/30/20 111 lb 1.9 oz (50.4 kg)  05/09/20 112 lb (50.8 kg)  04/25/20 110 lb (49.9 kg)     GEN: Patient is in no acute distress HEENT: Normal NECK: No JVD; No carotid bruits LYMPHATICS: No lymphadenopathy CARDIAC: Hear sounds regular, 2/6 systolic murmur at the apex. RESPIRATORY:  Clear to auscultation without rales, wheezing or rhonchi  ABDOMEN: Soft, non-tender, non-distended MUSCULOSKELETAL:  No edema; No deformity  SKIN: Warm and dry NEUROLOGIC:  Alert and oriented x 3 PSYCHIATRIC:  Normal affect   Signed, Garwin Brothers, MD  06/30/2020 9:58 AM    [facility]

## 2020-06-30 NOTE — Patient Instructions (Signed)
Medication Instructions:  No medication changes. *If you need a refill on your cardiac medications before your next appointment, please call your pharmacy*   Lab Work: None ordered If you have labs (blood work) drawn today and your tests are completely normal, you will receive your results only by: . MyChart Message (if you have MyChart) OR . A paper copy in the mail If you have any lab test that is abnormal or we need to change your treatment, we will call you to review the results.   Testing/Procedures: None ordered   Follow-Up: At [facility], you and your health needs are our priority.  As part of our continuing mission to provide you with exceptional heart care, we have created designated Provider Care Teams.  These Care Teams include your primary Cardiologist (physician) and Advanced Practice Providers (APPs -  Physician Assistants and Nurse Practitioners) who all work together to provide you with the care you need, when you need it.  We recommend signing up for the patient portal called "MyChart".  Sign up information is provided on this After Visit Summary.  MyChart is used to connect with patients for Virtual Visits (Telemedicine).  Patients are able to view lab/test results, encounter notes, upcoming appointments, etc.  Non-urgent messages can be sent to your provider as well.   To learn more about what you can do with MyChart, go to [email or web address].    Your next appointment:   1 year(s)  The format for your next appointment:   In Person  Provider:   Rajan Revankar, MD   Other Instructions NA  

## 2020-09-07 DIAGNOSIS — H5213 Myopia, bilateral: Secondary | ICD-10-CM | POA: Diagnosis not present

## 2020-09-07 DIAGNOSIS — H524 Presbyopia: Secondary | ICD-10-CM | POA: Diagnosis not present

## 2020-09-07 DIAGNOSIS — H52203 Unspecified astigmatism, bilateral: Secondary | ICD-10-CM | POA: Diagnosis not present

## 2020-09-21 DIAGNOSIS — D225 Melanocytic nevi of trunk: Secondary | ICD-10-CM | POA: Diagnosis not present

## 2020-09-21 DIAGNOSIS — L814 Other melanin hyperpigmentation: Secondary | ICD-10-CM | POA: Diagnosis not present

## 2020-09-21 DIAGNOSIS — L988 Other specified disorders of the skin and subcutaneous tissue: Secondary | ICD-10-CM | POA: Diagnosis not present

## 2020-09-21 DIAGNOSIS — D2262 Melanocytic nevi of left upper limb, including shoulder: Secondary | ICD-10-CM | POA: Diagnosis not present

## 2020-09-21 DIAGNOSIS — I781 Nevus, non-neoplastic: Secondary | ICD-10-CM | POA: Diagnosis not present

## 2020-09-21 DIAGNOSIS — D2271 Melanocytic nevi of right lower limb, including hip: Secondary | ICD-10-CM | POA: Diagnosis not present

## 2020-09-21 DIAGNOSIS — D485 Neoplasm of uncertain behavior of skin: Secondary | ICD-10-CM | POA: Diagnosis not present

## 2020-11-02 DIAGNOSIS — D225 Melanocytic nevi of trunk: Secondary | ICD-10-CM | POA: Diagnosis not present

## 2020-11-02 DIAGNOSIS — D485 Neoplasm of uncertain behavior of skin: Secondary | ICD-10-CM | POA: Diagnosis not present

## 2020-12-20 ENCOUNTER — Other Ambulatory Visit: Payer: Self-pay | Admitting: Gynecology

## 2020-12-20 DIAGNOSIS — Z1231 Encounter for screening mammogram for malignant neoplasm of breast: Secondary | ICD-10-CM

## 2021-01-11 DIAGNOSIS — I781 Nevus, non-neoplastic: Secondary | ICD-10-CM | POA: Diagnosis not present

## 2021-01-11 DIAGNOSIS — L814 Other melanin hyperpigmentation: Secondary | ICD-10-CM | POA: Diagnosis not present

## 2021-01-11 DIAGNOSIS — D2262 Melanocytic nevi of left upper limb, including shoulder: Secondary | ICD-10-CM | POA: Diagnosis not present

## 2021-01-11 DIAGNOSIS — L821 Other seborrheic keratosis: Secondary | ICD-10-CM | POA: Diagnosis not present

## 2021-02-10 ENCOUNTER — Ambulatory Visit
Admission: RE | Admit: 2021-02-10 | Discharge: 2021-02-10 | Disposition: A | Discharge status: Home / Self Care | Payer: BC Managed Care – PPO | Source: Ambulatory Visit | Attending: Gynecology | Admitting: Gynecology

## 2021-02-10 ENCOUNTER — Other Ambulatory Visit: Payer: Self-pay

## 2021-02-10 DIAGNOSIS — Z1231 Encounter for screening mammogram for malignant neoplasm of breast: Secondary | ICD-10-CM | POA: Diagnosis not present

## 2021-02-15 ENCOUNTER — Other Ambulatory Visit: Payer: Self-pay | Admitting: Gynecology

## 2021-02-15 DIAGNOSIS — R928 Other abnormal and inconclusive findings on diagnostic imaging of breast: Secondary | ICD-10-CM

## 2021-03-03 ENCOUNTER — Other Ambulatory Visit: Payer: BC Managed Care – PPO

## 2021-03-08 ENCOUNTER — Other Ambulatory Visit: Payer: Self-pay

## 2021-03-08 ENCOUNTER — Ambulatory Visit
Admission: RE | Admit: 2021-03-08 | Discharge: 2021-03-08 | Disposition: A | Discharge status: Home / Self Care | Payer: BC Managed Care – PPO | Source: Ambulatory Visit | Attending: Gynecology | Admitting: Gynecology

## 2021-03-08 DIAGNOSIS — R928 Other abnormal and inconclusive findings on diagnostic imaging of breast: Secondary | ICD-10-CM

## 2021-03-08 DIAGNOSIS — R922 Inconclusive mammogram: Secondary | ICD-10-CM | POA: Diagnosis not present

## 2021-04-25 DIAGNOSIS — Z85828 Personal history of other malignant neoplasm of skin: Secondary | ICD-10-CM | POA: Diagnosis not present

## 2021-04-25 DIAGNOSIS — L910 Hypertrophic scar: Secondary | ICD-10-CM | POA: Diagnosis not present

## 2021-04-25 DIAGNOSIS — D2272 Melanocytic nevi of left lower limb, including hip: Secondary | ICD-10-CM | POA: Diagnosis not present

## 2021-04-25 DIAGNOSIS — D22121 Melanocytic nevi of left upper eyelid, including canthus: Secondary | ICD-10-CM | POA: Diagnosis not present

## 2021-07-26 DIAGNOSIS — Z85828 Personal history of other malignant neoplasm of skin: Secondary | ICD-10-CM | POA: Diagnosis not present

## 2021-07-26 DIAGNOSIS — D2262 Melanocytic nevi of left upper limb, including shoulder: Secondary | ICD-10-CM | POA: Diagnosis not present

## 2021-07-26 DIAGNOSIS — D485 Neoplasm of uncertain behavior of skin: Secondary | ICD-10-CM | POA: Diagnosis not present

## 2021-07-26 DIAGNOSIS — L814 Other melanin hyperpigmentation: Secondary | ICD-10-CM | POA: Diagnosis not present

## 2021-07-26 DIAGNOSIS — L821 Other seborrheic keratosis: Secondary | ICD-10-CM | POA: Diagnosis not present

## 2021-07-26 DIAGNOSIS — D225 Melanocytic nevi of trunk: Secondary | ICD-10-CM | POA: Diagnosis not present

## 2021-09-11 DIAGNOSIS — M9904 Segmental and somatic dysfunction of sacral region: Secondary | ICD-10-CM | POA: Diagnosis not present

## 2021-09-11 DIAGNOSIS — M9903 Segmental and somatic dysfunction of lumbar region: Secondary | ICD-10-CM | POA: Diagnosis not present

## 2021-09-11 DIAGNOSIS — M9902 Segmental and somatic dysfunction of thoracic region: Secondary | ICD-10-CM | POA: Diagnosis not present

## 2021-09-11 DIAGNOSIS — M5386 Other specified dorsopathies, lumbar region: Secondary | ICD-10-CM | POA: Diagnosis not present

## 2021-09-14 DIAGNOSIS — M9904 Segmental and somatic dysfunction of sacral region: Secondary | ICD-10-CM | POA: Diagnosis not present

## 2021-09-14 DIAGNOSIS — M9902 Segmental and somatic dysfunction of thoracic region: Secondary | ICD-10-CM | POA: Diagnosis not present

## 2021-09-14 DIAGNOSIS — M9903 Segmental and somatic dysfunction of lumbar region: Secondary | ICD-10-CM | POA: Diagnosis not present

## 2021-09-14 DIAGNOSIS — M5386 Other specified dorsopathies, lumbar region: Secondary | ICD-10-CM | POA: Diagnosis not present

## 2021-10-23 DIAGNOSIS — L821 Other seborrheic keratosis: Secondary | ICD-10-CM | POA: Diagnosis not present

## 2021-10-23 DIAGNOSIS — L814 Other melanin hyperpigmentation: Secondary | ICD-10-CM | POA: Diagnosis not present

## 2021-10-23 DIAGNOSIS — Z85820 Personal history of malignant melanoma of skin: Secondary | ICD-10-CM | POA: Diagnosis not present

## 2021-10-23 DIAGNOSIS — Z85828 Personal history of other malignant neoplasm of skin: Secondary | ICD-10-CM | POA: Diagnosis not present

## 2021-12-06 ENCOUNTER — Ambulatory Visit: Payer: BC Managed Care – PPO | Admitting: Cardiology

## 2021-12-08 ENCOUNTER — Ambulatory Visit: Payer: BC Managed Care – PPO | Admitting: Cardiology

## 2021-12-08 ENCOUNTER — Encounter: Payer: Self-pay | Admitting: Cardiology

## 2021-12-08 VITALS — BP 106/68 | HR 73 | Ht 63.0 in | Wt 122.4 lb

## 2021-12-08 DIAGNOSIS — Z0000 Encounter for general adult medical examination without abnormal findings: Secondary | ICD-10-CM

## 2021-12-08 DIAGNOSIS — R002 Palpitations: Secondary | ICD-10-CM | POA: Diagnosis not present

## 2021-12-08 NOTE — Patient Instructions (Signed)

## 2021-12-08 NOTE — Progress Notes (Signed)
Cardiology Office Note:    Date:  12/08/2021   ID:  Heather Woodard, DOB 1975/12/05, MRN [id number]  PCP:  Oneita Hurt, No  Cardiologist:  Garwin Brothers, MD   Referring MD: Waldon Merl, PA-C    ASSESSMENT:    1. Routine general medical examination at a health care facility   2. Palpitations    PLAN:    In order of problems listed above:  Primary prevention stressed with the patient.  Importance of compliance with diet and medication stressed she vocalized understanding.  She was advised to walk at least half an hour a day 5 days a week and she promises to do so. Palpitations: These have resolved and she is happy about it.  I told her that I will see her in follow-up on a as needed basis.  She was advised to get established with primary care and she will do so in the next few weeks.   Medication Adjustments/Labs and Tests Ordered: Current medicines are reviewed at length with the patient today.  Concerns regarding medicines are outlined above.  No orders of the defined types were placed in this encounter.  No orders of the defined types were placed in this encounter.    No chief complaint on file.    History of Present Illness:    Heather Woodard is a 46 y.o. female.  Patient has past medical history of palpitations for which she was evaluated.  She also has a history of mixed dyslipidemia.  She has done well with diet and exercise.  She denies any chest pain orthopnea or PND.  Her palpitations have resolved.  At the time of my evaluation, the patient is alert awake oriented and in no distress.  Past Medical History:  Diagnosis Date   Cardiac murmur 05/09/2020   CHEST PAIN 12/30/2009   Qualifier: Diagnosis of  By: Beverely Low MD, Natalia Leatherwood     History of chicken pox    History of skin cancer 10/27/2019   Formatting of this note might be different from the original. MIS on upper chest/supraclavicular, August 2019 [facility] on chest History of dysplatic nevi including one  on the chest biopsied 2 months ago pending excision   Migraines    Neck pain on right side 06/03/2013   Palpitations 05/09/2020   Routine general medical examination at a health care facility 06/03/2013   URI 10/06/2009   Qualifier: Diagnosis of  By: Beverely Low MD, Natalia Leatherwood     Varicose veins of lower extremities with other complications 02/15/2014    Past Surgical History:  Procedure Laterality Date   AUGMENTATION MAMMAPLASTY     BREAST BIOPSY Right 11/05/2017   NO PAST SURGERIES      Current Medications: No outpatient medications have been marked as taking for the 12/08/21 encounter (Office Visit) with Amair Shrout, Aundra Dubin, MD.     Allergies:   Sulfonamide derivatives   Social History   Socioeconomic History   Marital status: Married    Spouse name: Not on file   Number of children: Not on file   Years of education: Not on file   Highest education level: Not on file  Occupational History   Not on file  Tobacco Use   Smoking status: Never   Smokeless tobacco: Never  Substance and Sexual Activity   Alcohol use: No    Alcohol/week: 0.0 standard drinks of alcohol   Drug use: No   Sexual activity: Yes  Other Topics Concern   Not on file  Social History Narrative   Not on file   Social Determinants of Health   Financial Resource Strain: Not on file  Food Insecurity: Not on file  Transportation Needs: Not on file  Physical Activity: Not on file  Stress: Not on file  Social Connections: Not on file     Family History: The patient's family history includes Alzheimer's disease in her paternal grandmother; Cancer in her father; Emphysema in her maternal grandfather; Rheum arthritis in her paternal aunt; Varicose Veins in her maternal grandmother and mother.  ROS:   Please see the history of present illness.    All other systems reviewed and are negative.  EKGs/Labs/Other Studies Reviewed:    The following studies were reviewed today: EKG was unremarkable intervals sinus  bradycardia nonspecific ST-T changes   Recent Labs: No results found for requested labs within last 365 days.  Recent Lipid Panel    Component Value Date/Time   CHOL 177 04/25/2020 0956   TRIG 75.0 04/25/2020 0956   HDL 63.00 04/25/2020 0956   CHOLHDL 3 04/25/2020 0956   VLDL 15.0 04/25/2020 0956   LDLCALC 99 04/25/2020 0956    Physical Exam:    VS:  BP 106/68   Pulse 73   Ht 5\' 3"  (1.6 m)   Wt 122 lb 6.4 oz (55.5 kg)   SpO2 99%   BMI 21.68 kg/m     Wt Readings from Last 3 Encounters:  12/08/21 122 lb 6.4 oz (55.5 kg)  06/30/20 111 lb 1.9 oz (50.4 kg)  05/09/20 112 lb (50.8 kg)     GEN: Patient is in no acute distress HEENT: Normal NECK: No JVD; No carotid bruits LYMPHATICS: No lymphadenopathy CARDIAC: Hear sounds regular, 2/6 systolic murmur at the apex. RESPIRATORY:  Clear to auscultation without rales, wheezing or rhonchi  ABDOMEN: Soft, non-tender, non-distended MUSCULOSKELETAL:  No edema; No deformity  SKIN: Warm and dry NEUROLOGIC:  Alert and oriented x 3 PSYCHIATRIC:  Normal affect   Signed, 14/06/21, MD  12/08/2021 8:36 AM     [facility]

## 2021-12-11 ENCOUNTER — Ambulatory Visit: Payer: BC Managed Care – PPO | Admitting: Cardiology

## 2021-12-20 ENCOUNTER — Ambulatory Visit: Payer: BC Managed Care – PPO | Admitting: Registered Nurse

## 2021-12-25 ENCOUNTER — Encounter: Payer: Self-pay | Admitting: Registered Nurse

## 2021-12-25 ENCOUNTER — Ambulatory Visit (INDEPENDENT_AMBULATORY_CARE_PROVIDER_SITE_OTHER): Payer: BC Managed Care – PPO | Admitting: Registered Nurse

## 2021-12-25 ENCOUNTER — Other Ambulatory Visit: Payer: Self-pay

## 2021-12-25 VITALS — BP 112/68 | HR 96 | Temp 98.1°F | Resp 18 | Ht 63.0 in | Wt 120.0 lb

## 2021-12-25 DIAGNOSIS — Z0000 Encounter for general adult medical examination without abnormal findings: Secondary | ICD-10-CM | POA: Diagnosis not present

## 2021-12-25 DIAGNOSIS — Z13220 Encounter for screening for lipoid disorders: Secondary | ICD-10-CM | POA: Diagnosis not present

## 2021-12-25 DIAGNOSIS — Z130 Encounter for screening for diseases of the blood and blood-forming organs and certain disorders involving the immune mechanism: Secondary | ICD-10-CM | POA: Diagnosis not present

## 2021-12-25 DIAGNOSIS — Z1159 Encounter for screening for other viral diseases: Secondary | ICD-10-CM

## 2021-12-25 DIAGNOSIS — Z13228 Encounter for screening for other metabolic disorders: Secondary | ICD-10-CM

## 2021-12-25 DIAGNOSIS — M546 Pain in thoracic spine: Secondary | ICD-10-CM

## 2021-12-25 DIAGNOSIS — Z1329 Encounter for screening for other suspected endocrine disorder: Secondary | ICD-10-CM | POA: Diagnosis not present

## 2021-12-25 DIAGNOSIS — G8929 Other chronic pain: Secondary | ICD-10-CM

## 2021-12-25 LAB — LIPID PANEL
Cholesterol: 193 mg/dL (ref 0–200)
HDL: 64.5 mg/dL (ref >39.00)
LDL Cholesterol: 119 mg/dL — ABNORMAL HIGH (ref 0–99)
NonHDL: 128.87
Total CHOL/HDL Ratio: 3
Triglycerides: 48 mg/dL (ref 0.0–149.0)
VLDL: 9.6 mg/dL (ref 0.0–40.0)

## 2021-12-25 LAB — CBC WITH DIFFERENTIAL/PLATELET
Basophils Absolute: 0 10*3/uL (ref 0.0–0.1)
Basophils Relative: 0.7 % (ref 0.0–3.0)
Eosinophils Absolute: 0.1 10*3/uL (ref 0.0–0.7)
Eosinophils Relative: 1.7 % (ref 0.0–5.0)
HCT: 41.5 % (ref 36.0–46.0)
Hemoglobin: 13.7 g/dL (ref 12.0–15.0)
Lymphocytes Relative: 32.7 % (ref 12.0–46.0)
Lymphs Abs: 2.1 10*3/uL (ref 0.7–4.0)
MCHC: 33.1 g/dL (ref 30.0–36.0)
MCV: 94.9 fl (ref 78.0–100.0)
Monocytes Absolute: 0.4 10*3/uL (ref 0.1–1.0)
Monocytes Relative: 7 % (ref 3.0–12.0)
Neutro Abs: 3.7 10*3/uL (ref 1.4–7.7)
Neutrophils Relative %: 57.9 % (ref 43.0–77.0)
Platelets: 199 10*3/uL (ref 150.0–400.0)
RBC: 4.37 Mil/uL (ref 3.87–5.11)
RDW: 12.9 % (ref 11.5–15.5)
WBC: 6.4 10*3/uL (ref 4.0–10.5)

## 2021-12-25 LAB — HEMOGLOBIN A1C: Hgb A1c MFr Bld: 5.7 % (ref 4.6–6.5)

## 2021-12-25 LAB — COMPREHENSIVE METABOLIC PANEL
ALT: 13 U/L (ref 0–35)
AST: 18 U/L (ref 0–37)
Albumin: 4.7 g/dL (ref 3.5–5.2)
Alkaline Phosphatase: 55 U/L (ref 39–117)
BUN: 15 mg/dL (ref 6–23)
CO2: 24 mEq/L (ref 19–32)
Calcium: 9.4 mg/dL (ref 8.4–10.5)
Chloride: 105 mEq/L (ref 96–112)
Creatinine, Ser: 0.66 mg/dL (ref 0.40–1.20)
GFR: 106.3 mL/min (ref >60.00)
Glucose, Bld: 88 mg/dL (ref 70–99)
Potassium: 4.2 mEq/L (ref 3.5–5.1)
Sodium: 137 mEq/L (ref 135–145)
Total Bilirubin: 0.7 mg/dL (ref 0.2–1.2)
Total Protein: 7.3 g/dL (ref 6.0–8.3)

## 2021-12-25 LAB — TSH: TSH: 0.53 u[IU]/mL (ref 0.35–5.50)

## 2021-12-25 MED ORDER — CYCLOBENZAPRINE HCL 5 MG PO TABS: 5.0000 mg | ORAL_TABLET | Freq: Three times a day (TID) | ORAL | 1 refills | Status: DC | PRN

## 2021-12-25 NOTE — Progress Notes (Signed)
Complete physical exam  Patient: Heather Woodard   DOB: [birth year]   46 y.o. Female  MRN: [id number] Visit Date: 12/25/2021  Subjective:    Chief Complaint  Patient presents with   New Patient (Initial Visit)    Patient states she is here to establish care and discuss referrals    Heather Woodard is a 46 y.o. female who presents today for a complete physical exam. She reports consuming a general diet.     She generally feels well. She reports sleeping well. She does ]have additional problems to discuss today.   Vision:Within the last year Dental:Within Last 6 months STD Screen:No PSA:No  Thoracic back pain No acute injury or trauma Stiffness in paraspinal muscles No urinary symptoms, saddle anesthesia Feels better when resting Has taken aleve with some relief  Most recent fall risk assessment:    12/25/2021   10:36 AM  Fall Risk   Falls in the past year? 0  Number falls in past yr: 0  Injury with Fall? 0  Risk for fall due to : No Fall Risks  Follow up Falls evaluation completed     Most recent depression screenings:    12/25/2021   10:37 AM 04/25/2020    9:33 AM  PHQ 2/9 Scores  PHQ - 2 Score 0 1  PHQ- 9 Score 0 1     Patient Active Problem List   Diagnosis Date Noted   Palpitations 05/09/2020   Cardiac murmur 05/09/2020   History of chicken pox    Migraines    History of skin cancer 10/27/2019   Varicose veins of lower extremities with other complications 02/15/2014   Neck pain on right side 06/03/2013   CHEST PAIN 12/30/2009   URI 10/06/2009   Past Medical History:  Diagnosis Date   Cardiac murmur 05/09/2020   CHEST PAIN 12/30/2009   Qualifier: Diagnosis of  By: Beverely Low MD, Natalia Leatherwood     History of chicken pox    History of skin cancer 10/27/2019   Formatting of this note might be different from the original. MIS on upper chest/supraclavicular, August 2019 [facility] on chest History of dysplatic nevi including one on the chest biopsied 2  months ago pending excision   Migraines    Neck pain on right side 06/03/2013   Palpitations 05/09/2020   Routine general medical examination at a health care facility 06/03/2013   URI 10/06/2009   Qualifier: Diagnosis of  By: Beverely Low MD, Natalia Leatherwood     Varicose veins of lower extremities with other complications 02/15/2014   Past Surgical History:  Procedure Laterality Date   AUGMENTATION MAMMAPLASTY     BREAST BIOPSY Right 11/05/2017   NO PAST SURGERIES     Social History   Tobacco Use   Smoking status: Never   Smokeless tobacco: Never  Substance Use Topics   Alcohol use: No    Alcohol/week: 0.0 standard drinks of alcohol   Drug use: No   Social History   Socioeconomic History   Marital status: Married    Spouse name: Not on file   Number of children: Not on file   Years of education: Not on file   Highest education level: Not on file  Occupational History   Not on file  Tobacco Use   Smoking status: Never   Smokeless tobacco: Never  Substance and Sexual Activity   Alcohol use: No    Alcohol/week: 0.0 standard drinks of alcohol   Drug use: No   Sexual activity:  Yes  Other Topics Concern   Not on file  Social History Narrative   Not on file   Social Determinants of Health   Financial Resource Strain: Not on file  Food Insecurity: Not on file  Transportation Needs: Not on file  Physical Activity: Not on file  Stress: Not on file  Social Connections: Not on file  Intimate Partner Violence: Not on file   Family Status  Relation Name Status   Father  Deceased   MGF  Deceased   PGM  Deceased   Mother  Alive   MGM  Alive   PGF  Deceased   Emelda Brothers  (Not Specified)   Family History  Problem Relation Age of Onset   Cancer Father        Melanoma   Emphysema Maternal Grandfather    Alzheimer's disease Paternal Grandmother    Varicose Veins Mother    Varicose Veins Maternal Grandmother    Rheum arthritis Paternal Aunt    Allergies  Allergen Reactions    Sulfonamide Derivatives Rash     Patient Care Team: Janeece Agee, NP as PCP - General (Adult Health Nurse Practitioner) Key, Verita Schneiders, NP as Nurse Practitioner (Gynecology)   Medications: No outpatient medications prior to visit.   No facility-administered medications prior to visit.    Review of Systems  Constitutional: Negative.   HENT: Negative.    Eyes: Negative.   Respiratory: Negative.    Cardiovascular: Negative.   Gastrointestinal: Negative.   Genitourinary: Negative.   Musculoskeletal: Negative.   Skin: Negative.   Neurological: Negative.   Psychiatric/Behavioral: Negative.    All other systems reviewed and are negative.   Last CBC Lab Results  Component Value Date   WBC 6.5 04/25/2020   HGB 13.7 04/25/2020   HCT 40.6 04/25/2020   MCV 94.2 04/25/2020   RDW 13.5 04/25/2020   PLT 218.0 04/25/2020   Last metabolic panel Lab Results  Component Value Date   GLUCOSE 87 04/25/2020   NA 138 04/25/2020   K 4.1 04/25/2020   CL 104 04/25/2020   CO2 26 04/25/2020   BUN 13 04/25/2020   CREATININE 0.67 04/25/2020   CALCIUM 9.2 04/25/2020   PROT 6.9 04/25/2020   ALBUMIN 4.4 04/25/2020   BILITOT 0.5 04/25/2020   ALKPHOS 65 04/25/2020   AST 18 04/25/2020   ALT 12 04/25/2020   Last lipids Lab Results  Component Value Date   CHOL 177 04/25/2020   HDL 63.00 04/25/2020   LDLCALC 99 04/25/2020   TRIG 75.0 04/25/2020   CHOLHDL 3 04/25/2020   Last hemoglobin A1c Lab Results  Component Value Date   HGBA1C 5.5 04/23/2019   Last thyroid functions Lab Results  Component Value Date   TSH 0.95 04/25/2020   Last vitamin D No results found for: "25OHVITD2", "25OHVITD3", "VD25OH" Last vitamin B12 and Folate No results found for: "VITAMINB12", "FOLATE"      Objective:     BP 112/68   Pulse 96   Temp 98.1 F (36.7 C) (Temporal)   Resp 18   Ht 5\' 3"  (1.6 m)   Wt 120 lb (54.4 kg)   SpO2 99%   BMI 21.26 kg/m   BP Readings from Last 3 Encounters:   12/25/21 112/68  12/08/21 106/68  06/30/20 120/70   Wt Readings from Last 3 Encounters:  12/25/21 120 lb (54.4 kg)  12/08/21 122 lb 6.4 oz (55.5 kg)  06/30/20 111 lb 1.9 oz (50.4 kg)   SpO2 Readings from  Last 3 Encounters:  12/25/21 99%  12/08/21 99%  06/30/20 100%      Physical Exam Vitals and nursing note reviewed.  Constitutional:      General: She is not in acute distress.    Appearance: Normal appearance. She is not ill-appearing, toxic-appearing or diaphoretic.  HENT:     Head: Normocephalic and atraumatic.     Right Ear: Tympanic membrane, ear canal and external ear normal. There is no impacted cerumen.     Left Ear: Tympanic membrane, ear canal and external ear normal. There is no impacted cerumen.     Nose: Nose normal. No congestion or rhinorrhea.     Mouth/Throat:     Mouth: Mucous membranes are moist.     Pharynx: Oropharynx is clear. No oropharyngeal exudate or posterior oropharyngeal erythema.  Eyes:     General: No scleral icterus.       Right eye: No discharge.        Left eye: No discharge.     Extraocular Movements: Extraocular movements intact.     Conjunctiva/sclera: Conjunctivae normal.     Pupils: Pupils are equal, round, and reactive to light.  Neck:     Vascular: No carotid bruit.  Cardiovascular:     Rate and Rhythm: Normal rate and regular rhythm.     Pulses: Normal pulses.     Heart sounds: Normal heart sounds. No murmur heard.    No friction rub. No gallop.  Pulmonary:     Effort: Pulmonary effort is normal. No respiratory distress.     Breath sounds: Normal breath sounds. No stridor. No wheezing, rhonchi or rales.  Chest:     Chest wall: No tenderness.  Abdominal:     General: Abdomen is flat. Bowel sounds are normal. There is no distension.     Palpations: There is no mass.     Tenderness: There is no abdominal tenderness. There is no right CVA tenderness, left CVA tenderness, guarding or rebound.     Hernia: No hernia is present.   Musculoskeletal:        General: No swelling, tenderness, deformity or signs of injury. Normal range of motion.     Cervical back: Normal range of motion and neck supple. No rigidity or tenderness.     Right lower leg: No edema.     Left lower leg: No edema.  Lymphadenopathy:     Cervical: No cervical adenopathy.  Skin:    General: Skin is warm and dry.     Capillary Refill: Capillary refill takes less than 2 seconds.     Coloration: Skin is not jaundiced or pale.     Findings: No bruising, erythema, lesion or rash.  Neurological:     General: No focal deficit present.     Mental Status: She is alert and oriented to person, place, and time. Mental status is at baseline.     Cranial Nerves: No cranial nerve deficit.     Sensory: No sensory deficit.     Motor: No weakness.     Coordination: Coordination normal.     Gait: Gait normal.     Deep Tendon Reflexes: Reflexes normal.  Psychiatric:        Mood and Affect: Mood normal.        Behavior: Behavior normal.        Thought Content: Thought content normal.        Judgment: Judgment normal.      No results found for any visits on  12/25/21.    Assessment & Plan:    Routine Health Maintenance and Physical Exam  Immunization History  Administered Date(s) Administered   Tdap 04/23/2019    Health Maintenance  Topic Date Due   Hepatitis C Screening  Never done   PAP SMEAR-Modifier  12/25/2021 (Originally 10/30/2020)   HIV Screening  12/26/2022 (Originally 01/19/1992)   INFLUENZA VACCINE  01/02/2022   MAMMOGRAM  03/08/2022   TETANUS/TDAP  04/22/2029   HPV VACCINES  Aged Out    Discussed health benefits of physical activity, and encouraged her to engage in regular exercise appropriate for her age and condition.  Problem List Items Addressed This Visit   None Visit Diagnoses     Annual physical exam    -  Primary   Screening for endocrine, metabolic and immunity disorder       Relevant Orders   CBC with  Differential/Platelet   Comprehensive metabolic panel   Hemoglobin A1c   TSH   Lipid screening       Relevant Orders   Lipid panel   Encounter for screening for other viral diseases       Relevant Orders   Hepatitis C antibody   Chronic bilateral thoracic back pain       Relevant Medications   cyclobenzaprine (FLEXERIL) 5 MG tablet   Other Relevant Orders   Ambulatory referral to Physical Therapy      Return if symptoms worsen or fail to improve.     PLAN Flexeril and PT for back pain. Reviewed risks, benefits, and side effects, pt voices understanding. Otherwise exam unremarkable Labs collected. Will follow up with the patient as warranted. Patient encouraged to call clinic with any questions, comments, or concerns.   Janeece Agee, NP

## 2021-12-25 NOTE — Patient Instructions (Addendum)
Heather Woodard -   [name] to meet you!  We can plan on follow up based on lab results.  Take flexeril as needed. Use with caution, it may make you sleepy.  Physical Therapy will call you to schedule  Thank you,  [name]     If you have lab work done today you will be contacted with your lab results within the next 2 weeks.  If you have not heard from [place] then please contact us. The fastest way to get your results is to register for My Chart.   IF you received an x-ray today, you will receive an invoice from [facility]. Please contact [facility] at [phone] with questions or concerns regarding your invoice.   IF you received labwork today, you will receive an invoice from [place]. Please contact [facility] at [phone] with questions or concerns regarding your invoice.   Our billing staff will not be able to assist you with questions regarding bills from these companies.  You will be contacted with the lab results as soon as they are available. The fastest way to get your results is to activate your My Chart account. Instructions are located on the last page of this paperwork. If you have not heard from [place] regarding the results in 2 weeks, please contact this office.

## 2021-12-26 LAB — HEPATITIS C ANTIBODY: Hepatitis C Ab: NONREACTIVE

## 2022-01-10 ENCOUNTER — Ambulatory Visit: Payer: BC Managed Care – PPO

## 2022-01-22 DIAGNOSIS — D485 Neoplasm of uncertain behavior of skin: Secondary | ICD-10-CM | POA: Diagnosis not present

## 2022-01-22 DIAGNOSIS — D225 Melanocytic nevi of trunk: Secondary | ICD-10-CM | POA: Diagnosis not present

## 2022-01-22 DIAGNOSIS — D2271 Melanocytic nevi of right lower limb, including hip: Secondary | ICD-10-CM | POA: Diagnosis not present

## 2022-01-22 DIAGNOSIS — L814 Other melanin hyperpigmentation: Secondary | ICD-10-CM | POA: Diagnosis not present

## 2022-01-22 DIAGNOSIS — L820 Inflamed seborrheic keratosis: Secondary | ICD-10-CM | POA: Diagnosis not present

## 2022-01-22 DIAGNOSIS — Z85820 Personal history of malignant melanoma of skin: Secondary | ICD-10-CM | POA: Diagnosis not present

## 2022-02-02 ENCOUNTER — Other Ambulatory Visit: Payer: Self-pay | Admitting: Registered Nurse

## 2022-02-02 DIAGNOSIS — Z1231 Encounter for screening mammogram for malignant neoplasm of breast: Secondary | ICD-10-CM

## 2022-03-09 ENCOUNTER — Other Ambulatory Visit: Payer: Self-pay | Admitting: Registered Nurse

## 2022-03-09 ENCOUNTER — Ambulatory Visit
Admission: RE | Admit: 2022-03-09 | Discharge: 2022-03-09 | Disposition: A | Discharge status: Home / Self Care | Payer: BC Managed Care – PPO | Source: Ambulatory Visit | Attending: Registered Nurse | Admitting: Registered Nurse

## 2022-03-09 DIAGNOSIS — Z1231 Encounter for screening mammogram for malignant neoplasm of breast: Secondary | ICD-10-CM | POA: Diagnosis not present

## 2022-03-22 DIAGNOSIS — Z130 Encounter for screening for diseases of the blood and blood-forming organs and certain disorders involving the immune mechanism: Secondary | ICD-10-CM | POA: Diagnosis not present

## 2022-03-22 DIAGNOSIS — Z1389 Encounter for screening for other disorder: Secondary | ICD-10-CM | POA: Diagnosis not present

## 2022-03-22 DIAGNOSIS — Z01419 Encounter for gynecological examination (general) (routine) without abnormal findings: Secondary | ICD-10-CM | POA: Diagnosis not present

## 2022-05-01 ENCOUNTER — Other Ambulatory Visit: Payer: Self-pay | Admitting: Nurse Practitioner

## 2022-05-01 DIAGNOSIS — J069 Acute upper respiratory infection, unspecified: Secondary | ICD-10-CM

## 2022-05-01 MED ORDER — AZITHROMYCIN 250 MG PO TABS: ORAL_TABLET | ORAL | 0 refills | Status: AC

## 2022-05-08 DIAGNOSIS — L821 Other seborrheic keratosis: Secondary | ICD-10-CM | POA: Diagnosis not present

## 2022-05-08 DIAGNOSIS — D225 Melanocytic nevi of trunk: Secondary | ICD-10-CM | POA: Diagnosis not present

## 2022-05-08 DIAGNOSIS — L814 Other melanin hyperpigmentation: Secondary | ICD-10-CM | POA: Diagnosis not present

## 2022-05-08 DIAGNOSIS — D485 Neoplasm of uncertain behavior of skin: Secondary | ICD-10-CM | POA: Diagnosis not present

## 2022-05-08 DIAGNOSIS — L820 Inflamed seborrheic keratosis: Secondary | ICD-10-CM | POA: Diagnosis not present

## 2022-05-08 DIAGNOSIS — Z85820 Personal history of malignant melanoma of skin: Secondary | ICD-10-CM | POA: Diagnosis not present

## 2022-05-08 DIAGNOSIS — I781 Nevus, non-neoplastic: Secondary | ICD-10-CM | POA: Diagnosis not present

## 2022-09-05 DIAGNOSIS — L821 Other seborrheic keratosis: Secondary | ICD-10-CM | POA: Diagnosis not present

## 2022-09-05 DIAGNOSIS — I781 Nevus, non-neoplastic: Secondary | ICD-10-CM | POA: Diagnosis not present

## 2022-09-05 DIAGNOSIS — L814 Other melanin hyperpigmentation: Secondary | ICD-10-CM | POA: Diagnosis not present

## 2022-09-05 DIAGNOSIS — L570 Actinic keratosis: Secondary | ICD-10-CM | POA: Diagnosis not present

## 2022-09-05 DIAGNOSIS — D225 Melanocytic nevi of trunk: Secondary | ICD-10-CM | POA: Diagnosis not present

## 2022-09-25 DIAGNOSIS — N939 Abnormal uterine and vaginal bleeding, unspecified: Secondary | ICD-10-CM | POA: Diagnosis not present

## 2022-09-25 DIAGNOSIS — D251 Intramural leiomyoma of uterus: Secondary | ICD-10-CM | POA: Diagnosis not present

## 2023-01-08 DIAGNOSIS — Z85820 Personal history of malignant melanoma of skin: Secondary | ICD-10-CM | POA: Diagnosis not present

## 2023-01-08 DIAGNOSIS — L814 Other melanin hyperpigmentation: Secondary | ICD-10-CM | POA: Diagnosis not present

## 2023-01-08 DIAGNOSIS — L309 Dermatitis, unspecified: Secondary | ICD-10-CM | POA: Diagnosis not present

## 2023-01-08 DIAGNOSIS — L821 Other seborrheic keratosis: Secondary | ICD-10-CM | POA: Diagnosis not present

## 2023-01-29 ENCOUNTER — Other Ambulatory Visit: Payer: Self-pay | Admitting: Gynecology

## 2023-01-29 DIAGNOSIS — Z1231 Encounter for screening mammogram for malignant neoplasm of breast: Secondary | ICD-10-CM

## 2023-03-15 ENCOUNTER — Ambulatory Visit
Admission: RE | Admit: 2023-03-15 | Discharge: 2023-03-15 | Disposition: A | Discharge status: Home / Self Care | Payer: BC Managed Care – PPO | Source: Ambulatory Visit | Attending: Gynecology | Admitting: Gynecology

## 2023-03-15 DIAGNOSIS — Z1231 Encounter for screening mammogram for malignant neoplasm of breast: Secondary | ICD-10-CM | POA: Diagnosis not present

## 2023-03-27 DIAGNOSIS — Z130 Encounter for screening for diseases of the blood and blood-forming organs and certain disorders involving the immune mechanism: Secondary | ICD-10-CM | POA: Diagnosis not present

## 2023-03-27 DIAGNOSIS — Z01419 Encounter for gynecological examination (general) (routine) without abnormal findings: Secondary | ICD-10-CM | POA: Diagnosis not present

## 2023-03-27 DIAGNOSIS — Z124 Encounter for screening for malignant neoplasm of cervix: Secondary | ICD-10-CM | POA: Diagnosis not present

## 2023-03-27 DIAGNOSIS — Z1151 Encounter for screening for human papillomavirus (HPV): Secondary | ICD-10-CM | POA: Diagnosis not present

## 2023-04-08 IMAGING — MG DIGITAL SCREENING BREAST BILAT IMPLANT W/ TOMO W/ CAD
9 of 12 series · 9 of 28 positions shown · non-contrast
Comparison: Previous exams.

CLINICAL DATA: Screening.

EXAM:
DIGITAL SCREENING BILATERAL MAMMOGRAM WITH IMPLANTS, CAD AND
TOMOSYNTHESIS
TECHNIQUE: Bilateral screening digital craniocaudal and mediolateral oblique
mammograms were obtained. Bilateral screening digital breast
tomosynthesis was performed. The images were evaluated with
computer-aided detection. Standard and/or implant displaced views
were performed.

[R CC]
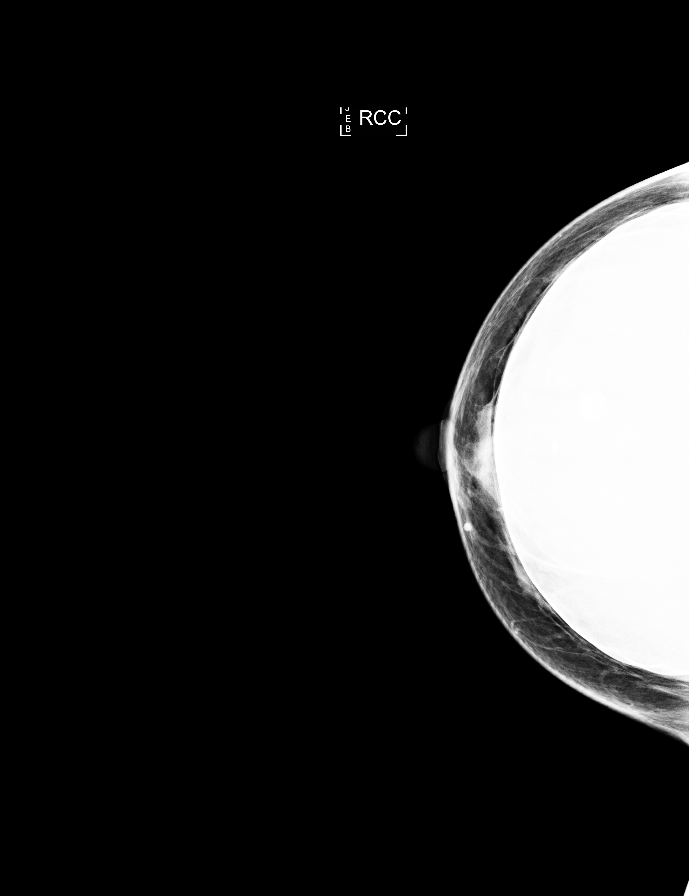

[R MLO]
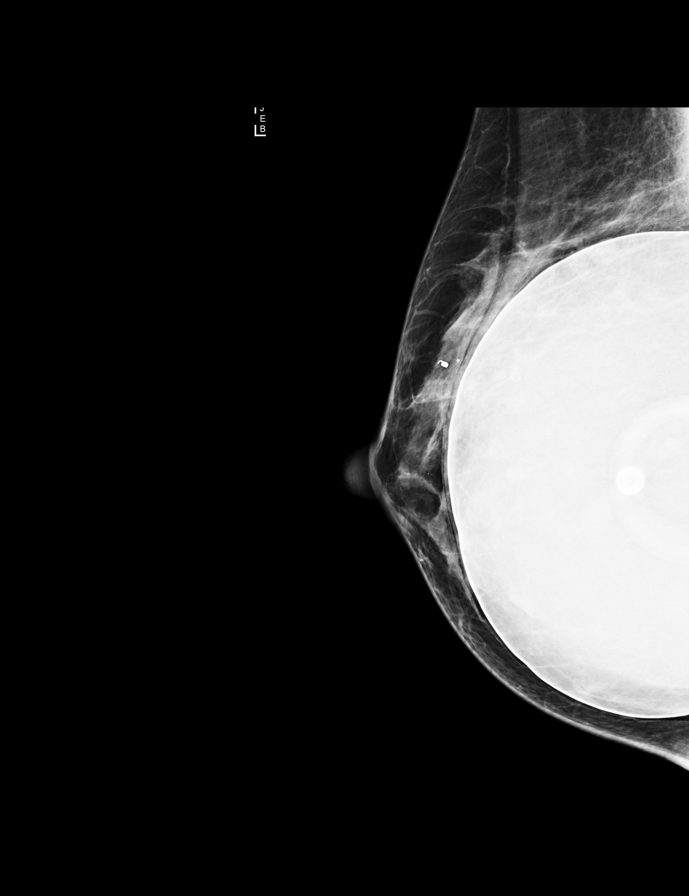

[L MLO]
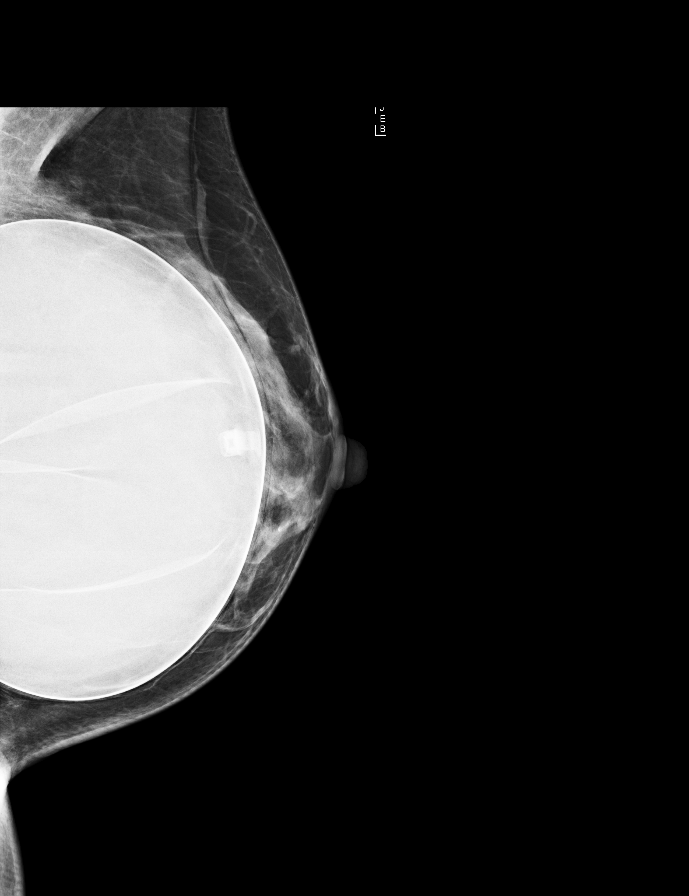

[L CC]
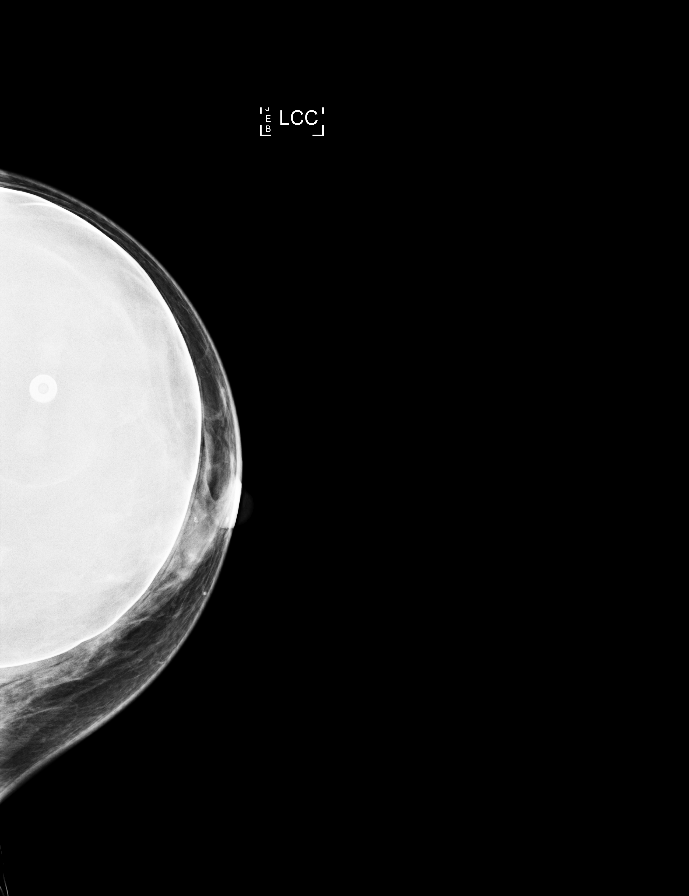

[L MLO synth-2D]
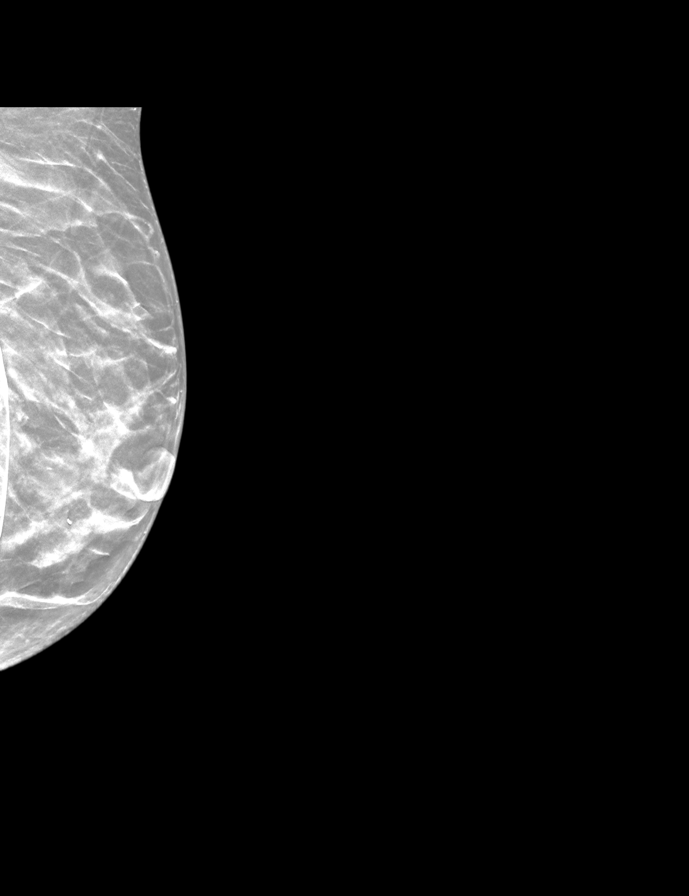

[R MLO synth-2D]
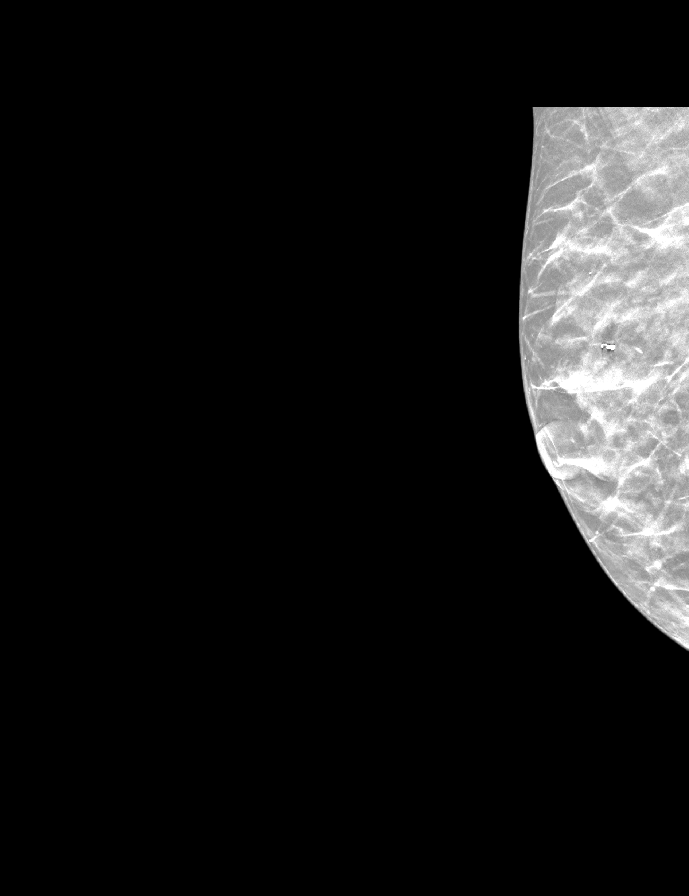

[R CC synth-2D]
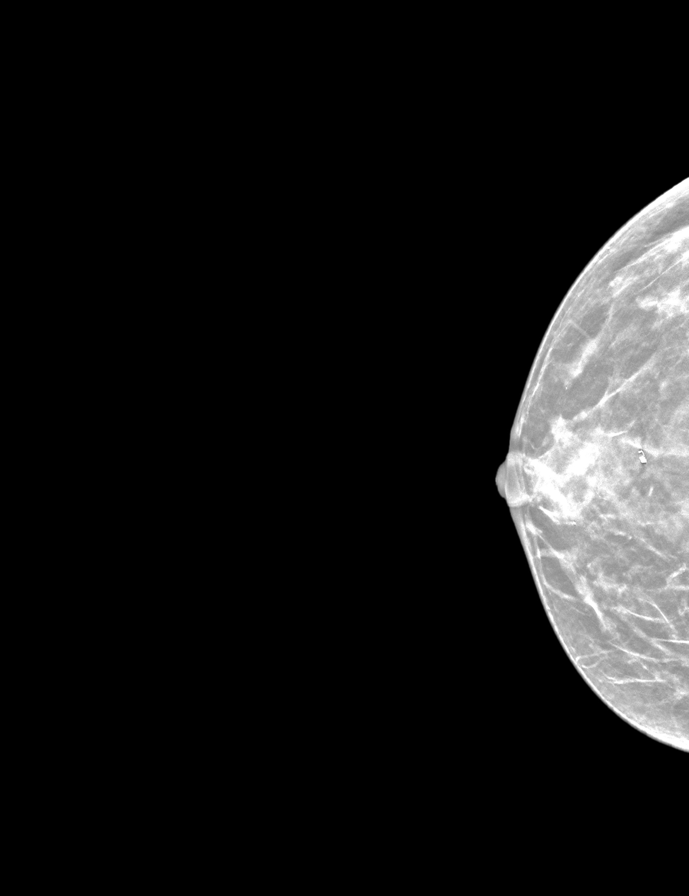

[L CC synth-2D]
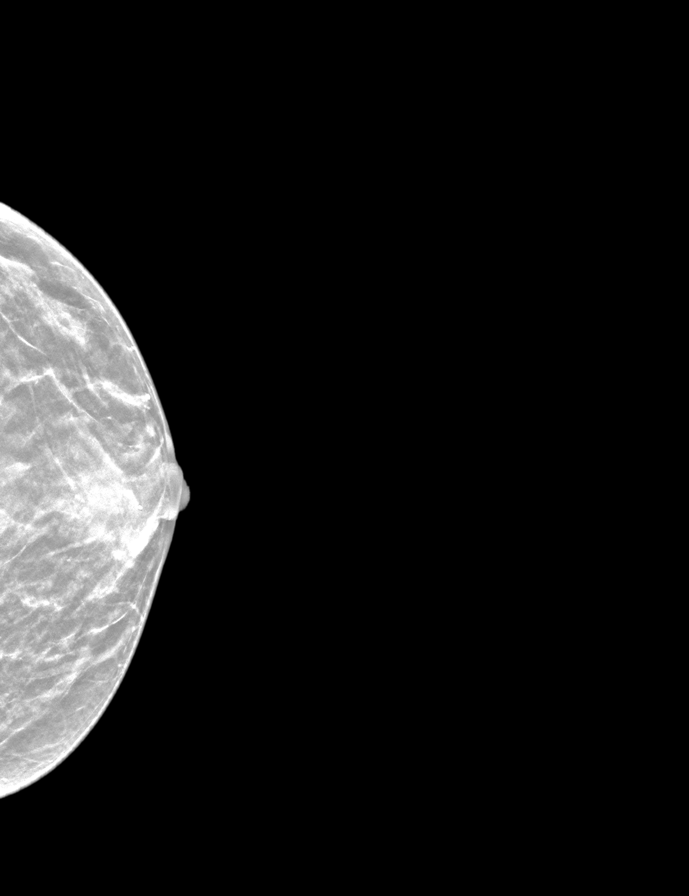

[L CCID BREAST TOMOSYNTHESIS IMAGE tomo · tomo slice 16/31.0]
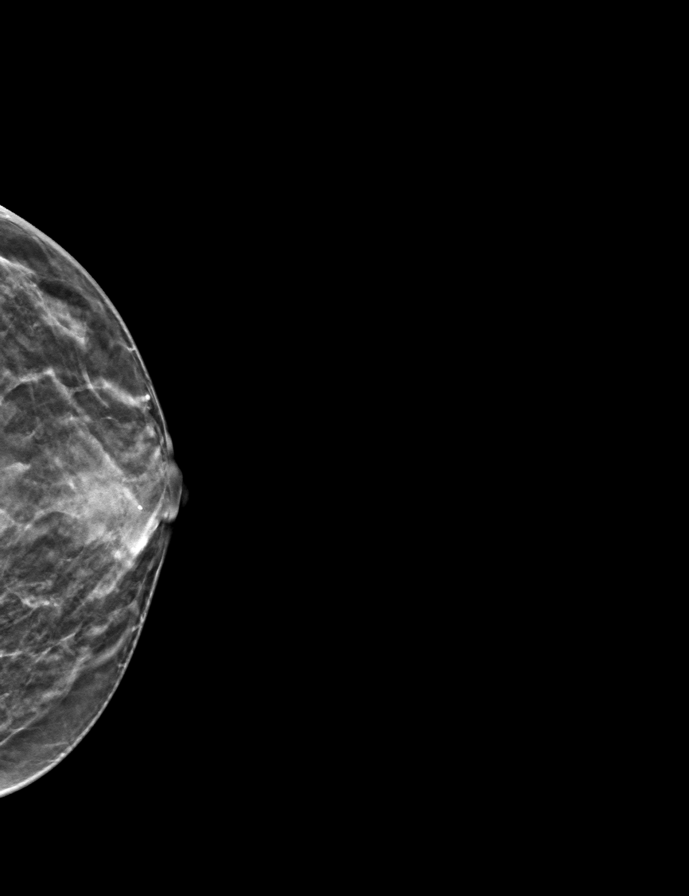

[9 of 28 positions shown; findings below may reference images not displayed]

ACR Breast Density Category c: The breast tissue is heterogeneously
dense, which may obscure small masses.
FINDINGS: In the left breast, a possible mass warrants further evaluation. In
the right breast, no findings suspicious for malignancy.

The patient has bilateral retropectoral saline implants.
IMPRESSION: Further evaluation is suggested for possible mass in the left
breast.

RECOMMENDATION:
Diagnostic mammogram and possibly ultrasound of the left breast.
(Code:YU-2-DD1)

BI-RADS CATEGORY  0: Incomplete. Need additional imaging evaluation
and/or prior mammograms for comparison.

## 2023-05-08 DIAGNOSIS — I781 Nevus, non-neoplastic: Secondary | ICD-10-CM | POA: Diagnosis not present

## 2023-05-08 DIAGNOSIS — L814 Other melanin hyperpigmentation: Secondary | ICD-10-CM | POA: Diagnosis not present

## 2023-05-08 DIAGNOSIS — D485 Neoplasm of uncertain behavior of skin: Secondary | ICD-10-CM | POA: Diagnosis not present

## 2023-05-08 DIAGNOSIS — Z85820 Personal history of malignant melanoma of skin: Secondary | ICD-10-CM | POA: Diagnosis not present

## 2023-05-08 DIAGNOSIS — L821 Other seborrheic keratosis: Secondary | ICD-10-CM | POA: Diagnosis not present

## 2023-05-08 DIAGNOSIS — B078 Other viral warts: Secondary | ICD-10-CM | POA: Diagnosis not present

## 2023-09-09 DIAGNOSIS — I781 Nevus, non-neoplastic: Secondary | ICD-10-CM | POA: Diagnosis not present

## 2023-09-09 DIAGNOSIS — D225 Melanocytic nevi of trunk: Secondary | ICD-10-CM | POA: Diagnosis not present

## 2023-09-09 DIAGNOSIS — L821 Other seborrheic keratosis: Secondary | ICD-10-CM | POA: Diagnosis not present

## 2023-09-09 DIAGNOSIS — L814 Other melanin hyperpigmentation: Secondary | ICD-10-CM | POA: Diagnosis not present

## 2023-11-01 ENCOUNTER — Ambulatory Visit: Attending: Cardiology | Admitting: Cardiology

## 2023-11-01 ENCOUNTER — Encounter: Payer: Self-pay | Admitting: Cardiology

## 2023-11-01 VITALS — BP 100/80 | HR 85 | Ht 63.0 in | Wt 116.0 lb

## 2023-11-01 DIAGNOSIS — R002 Palpitations: Secondary | ICD-10-CM

## 2023-11-01 NOTE — Patient Instructions (Signed)

## 2023-11-01 NOTE — Progress Notes (Signed)
 Cardiology Office Note:    Date:  11/01/2023   ID:  Heather Woodard, DOB [birth year], MRN [id number]  PCP:  Ulyess Gammons, NP  Cardiologist:  Nelia Balzarine, MD   Referring MD: Ulyess Gammons, NP    ASSESSMENT:    1. Palpitations    PLAN:    In order of problems listed above:  Primary prevention stressed with the patient.  Importance of compliance with diet medication stressed and patient verbalized standing. Palpitations: These have completely resolved and the patient is happy about. Postural hypotension: Patient's symptoms were evaluated.  I could not demonstrate postural hypotension today.  She is asymptomatic today.  Increasing salt and fluid intake in the diet was emphasized.  Compression stockings and those discussions were made with the patient and questions were answered to her satisfaction. Patient will be seen in follow-up appointment in 6 months or earlier if the patient has any concerns.    Medication Adjustments/Labs and Tests Ordered: Current medicines are reviewed at length with the patient today.  Concerns regarding medicines are outlined above.  Orders Placed This Encounter  Procedures   EKG 12-Lead   No orders of the defined types were placed in this encounter.    Chief Complaint  Patient presents with   Palpitations     History of Present Illness:    Heather Woodard is a 48 y.o. female.  Patient has past medical history of palpitations.  She gives history mentioning of some symptoms of postural hypotension.  She denies any chest pain orthopnea PND or syncope.  No dizziness.  No palpitations.  Her palpitations have resolved and she is happy about it.  At the time of my evaluation, the patient is alert awake oriented and in no distress.  Past Medical History:  Diagnosis Date   Acute upper respiratory infection 10/06/2009   Qualifier: Diagnosis of   By: Paulla Bossier MD, Loralie Rocher SNOMED Dx Update Oct 2024     Cardiac murmur  05/09/2020   CHEST PAIN 12/30/2009   Qualifier: Diagnosis of  By: Paulla Bossier MD, Dana Duncan     History of chicken pox    History of skin cancer 10/27/2019   Formatting of this note might be different from the original. MIS on upper chest/supraclavicular, August 2019 [facility] on chest History of dysplatic nevi including one on the chest biopsied 2 months ago pending excision   Migraines    Neck pain on right side 06/03/2013   Palpitations 05/09/2020   Routine general medical examination at a health care facility 06/03/2013   URI 10/06/2009   Qualifier: Diagnosis of  By: Paulla Bossier MD, Dana Duncan     Varicose veins of bilateral lower extremities with other complications 02/15/2014   IMO SNOMED Dx Update Oct 2024     Varicose veins of lower extremities with other complications 02/15/2014    Past Surgical History:  Procedure Laterality Date   AUGMENTATION MAMMAPLASTY     BREAST BIOPSY Right 11/05/2017   BENIGN BREAST TISSUE WITH FIBROCYSTIC CHANGE,    Current Medications: Current Meds  Medication Sig   APPLE CIDER VINEGAR PO Take 1 tablet by mouth daily.   Cholecalciferol (VITAMIN D3) 50 MCG (2000 UT) CAPS Take 2,000 Units by mouth daily.   Collagen-Vitamin C-Biotin (COLLAGEN PO) Take 1 tablet by mouth daily.   cyanocobalamin (VITAMIN B12) 500 MCG tablet Take 500 mcg by mouth daily.   Multiple Vitamin (MULTIVITAMIN) tablet Take 1 tablet by mouth daily.   Multiple Vitamins-Minerals (AIRBORNE  PO) Take 1 packet by mouth daily.     Allergies:   Sulfonamide derivatives   Social History   Socioeconomic History   Marital status: Married    Spouse name: Not on file   Number of children: Not on file   Years of education: Not on file   Highest education level: Not on file  Occupational History   Not on file  Tobacco Use   Smoking status: Never   Smokeless tobacco: Never  Substance and Sexual Activity   Alcohol use: No    Alcohol/week: 0.0 standard drinks of alcohol   Drug use: No   Sexual  activity: Yes  Other Topics Concern   Not on file  Social History Narrative   Not on file   Social Drivers of Health   Financial Resource Strain: Not on file  Food Insecurity: Not on file  Transportation Needs: Not on file  Physical Activity: Not on file  Stress: Not on file  Social Connections: Not on file     Family History: The patient's family history includes Alzheimer's disease in her paternal grandmother; Cancer in her father; Emphysema in her maternal grandfather; Rheum arthritis in her paternal aunt; Varicose Veins in her maternal grandmother and mother.  ROS:   Please see the history of present illness.    All other systems reviewed and are negative.  EKGs/Labs/Other Studies Reviewed:    The following studies were reviewed today: .Aaron AasEKG Interpretation Date/Time:  Friday Nov 01 2023 11:50:49 EDT Ventricular Rate:  85 PR Interval:  128 QRS Duration:  70 QT Interval:  360 QTC Calculation: 428 R Axis:   86  Text Interpretation: Sinus rhythm with occasional Premature ventricular complexes No previous ECGs available Confirmed by Hillis Lu [phone]) on 11/01/2023 12:02:25 PM     Recent Labs: No results found for requested labs within last 365 days.  Recent Lipid Panel    Component Value Date/Time   CHOL 193 12/25/2021 1155   TRIG 48.0 12/25/2021 1155   HDL 64.50 12/25/2021 1155   CHOLHDL 3 12/25/2021 1155   VLDL 9.6 12/25/2021 1155   LDLCALC 119 (H) 12/25/2021 1155    Physical Exam:    VS:  BP 100/80   Pulse 85   Ht 5\' 3"  (1.6 m)   Wt 116 lb (52.6 kg)   SpO2 99%   BMI 20.55 kg/m     Wt Readings from Last 3 Encounters:  11/01/23 116 lb (52.6 kg)  12/25/21 120 lb (54.4 kg)  12/08/21 122 lb 6.4 oz (55.5 kg)     GEN: Patient is in no acute distress HEENT: Normal NECK: No JVD; No carotid bruits LYMPHATICS: No lymphadenopathy CARDIAC: Hear sounds regular, 2/6 systolic murmur at the apex. RESPIRATORY:  Clear to auscultation without rales,  wheezing or rhonchi  ABDOMEN: Soft, non-tender, non-distended MUSCULOSKELETAL:  No edema; No deformity  SKIN: Warm and dry NEUROLOGIC:  Alert and oriented x 3 PSYCHIATRIC:  Normal affect   Signed, Nelia Balzarine, MD  11/01/2023 3:25 PM    [facility]

## 2023-11-19 ENCOUNTER — Encounter: Payer: Self-pay | Admitting: Nurse Practitioner

## 2023-11-19 ENCOUNTER — Ambulatory Visit: Payer: Self-pay | Admitting: Nurse Practitioner

## 2023-11-19 VITALS — BP 118/72 | HR 76 | Ht 63.0 in | Wt 117.2 lb

## 2023-11-19 DIAGNOSIS — Z0000 Encounter for general adult medical examination without abnormal findings: Secondary | ICD-10-CM

## 2023-11-19 DIAGNOSIS — R002 Palpitations: Secondary | ICD-10-CM | POA: Diagnosis not present

## 2023-11-19 DIAGNOSIS — N951 Menopausal and female climacteric states: Secondary | ICD-10-CM | POA: Diagnosis not present

## 2023-11-19 DIAGNOSIS — Z1211 Encounter for screening for malignant neoplasm of colon: Secondary | ICD-10-CM

## 2023-11-19 DIAGNOSIS — Z1329 Encounter for screening for other suspected endocrine disorder: Secondary | ICD-10-CM

## 2023-11-19 DIAGNOSIS — F418 Other specified anxiety disorders: Secondary | ICD-10-CM | POA: Insufficient documentation

## 2023-11-19 DIAGNOSIS — Z13228 Encounter for screening for other metabolic disorders: Secondary | ICD-10-CM

## 2023-11-19 DIAGNOSIS — M940 Chondrocostal junction syndrome [Tietze]: Secondary | ICD-10-CM | POA: Insufficient documentation

## 2023-11-19 DIAGNOSIS — Z130 Encounter for screening for diseases of the blood and blood-forming organs and certain disorders involving the immune mechanism: Secondary | ICD-10-CM

## 2023-11-19 DIAGNOSIS — Z1321 Encounter for screening for nutritional disorder: Secondary | ICD-10-CM

## 2023-11-19 MED ORDER — BUSPIRONE HCL 5 MG PO TABS: 5.0000 mg | ORAL_TABLET | Freq: Two times a day (BID) | ORAL | 1 refills | Status: AC | PRN

## 2023-11-19 MED ORDER — MELOXICAM 15 MG PO TABS: 15.0000 mg | ORAL_TABLET | Freq: Every day | ORAL | 3 refills | Status: AC

## 2023-11-19 NOTE — Assessment & Plan Note (Signed)
 Experiences anxiety with episodes of heightened stress and difficulty managing symptoms. Buspirone chosen as a starting medication due to its non-addictive nature and suitability for as-needed use. Discussed potential use of stronger medications like Xanax or Ativan if buspirone is ineffective. Emphasized importance of communication if symptoms persist or worsen. - Prescribe buspirone to be taken up to twice a day as needed for anxiety. - Discuss potential use of stronger medications like Xanax or Ativan if buspirone is ineffective. - Encourage communication if symptoms persist or worsen.

## 2023-11-19 NOTE — Assessment & Plan Note (Signed)
 Discussed importance of routine health screenings, specifically colon cancer screening. Explained options of colonoscopy and Cologuard, including procedures, frequency, and insurance coverage. Cologuard chosen as preferred initial screening method due to its non-invasive nature. - Order Cologuard test for colon cancer screening.

## 2023-11-19 NOTE — Assessment & Plan Note (Signed)
 Intermittent pain between the ribs, likely due to inflammation of the cartilage, present for 1.5 years. Pain is episodic, not activity-related, and non-tender to touch. Condition is painful but not serious. Discussed ibuprofen and lidocaine patches for pain relief. Meloxicam offered for convenience of once-daily dosing. Steroid injection under ultrasound guidance may be considered if symptoms worsen. - Prescribe meloxicam for pain management as needed. - Recommend over-the-counter lidocaine patches for pain relief. - Advise use of ibuprofen for pain management. - Suggest trying a heating pad for symptom relief. - Instruct to report if pain worsens or becomes persistent.

## 2023-11-19 NOTE — Patient Instructions (Addendum)
 I have sent in the meloxicam for you to try to for the back pain I have sent in the buspirone to help with the anxiety. If this is not better, please let me know and we can try alternatives.   Costochondritis  Costochondritis is inflammation of the tissue (cartilage) that connects the ribs to the breastbone (sternum). This causes pain in the front of the chest. The pain often starts slowly and involves more than one rib. What are the causes? This condition results from stress on the cartilage where your ribs attach to your sternum. The exact cause of this stress is not always known. The cause may be: Chest injury. Exercise or activity. This may include lifting. Severe coughing. What increases the risk? You are more likely to develop this condition if: You are female. You are 48 years old. You just started a new exercise or work activity. You have low levels of vitamin D . You have a condition that makes you cough often. What are the signs or symptoms? The main symptom of this condition is chest pain. The pain: Starts slowly and can be sharp or dull. Gets worse with deep breathing, coughing, or exercise. Gets better with rest. May be worse when you press on the affected area of your ribs and sternum. How is this diagnosed? This condition is diagnosed based on your symptoms, your medical history, and a physical exam. Your health care provider will check for pain when pressing on your sternum. You may also have tests to rule out other causes of chest pain. These may include: A chest X-ray. This may be done to check for lung problems. An electrocardiogram (ECG). This may be done to see if you have a heart problem that could be causing the pain. An imaging scan. This may be done to rule out a broken bone (fracture) in your chest or ribcage. How is this treated? This condition may go away on its own over time. Your health care provider may prescribe an NSAID, such as ibuprofen, to reduce  pain and inflammation. Treatment may also include: Resting and avoiding activities that make pain worse. Putting heat or ice on the area to reduce pain and inflammation. Doing exercises to stretch your chest muscles. If these treatments do not help, your health care provider may inject a numbing medicine at the spot where the sternum and rib connect. This can help relieve the pain. Follow these instructions at home: Managing pain, stiffness, and swelling     If directed, put ice on the painful area. To do this: Put ice in a plastic bag. Place a towel between your skin and the bag. Leave the ice on for 20 minutes, 2-3 times a day. If directed, apply heat to the affected area as often as told by your health care provider. Use the heat source that your health care provider recommends, such as a moist heat pack or a heating pad. Place a towel between your skin and the heat source. Leave the heat on for 20-30 minutes. If your skin turns bright red, remove the heat or ice right away to prevent skin damage. The risk of skin damage is higher if you cannot feel pain, heat, or cold. Activity Rest as told by your health care provider. Avoid activities that make pain worse. This includes activities that use the muscles in your chest, abdomen, and sides. You may have to avoid lifting. Ask your health care provider how much you can safely lift. Return to your normal activities  as told by your health care provider. Ask your health care provider what activities are safe for you. General instructions Take over-the-counter and prescription medicines only as told by your health care provider. Contact a health care provider if: You have chills or a fever. Your pain does not go away, or it gets worse. You have a cough that does not go away. Get help right away if: You feel short of breath. You have severe chest pain that does not get better with medicines, heat, or ice. These symptoms may be an emergency.  Get help right away. Call 911. Do not wait to see if the symptoms will go away. Do not drive yourself to the hospital. This information is not intended to replace advice given to you by your health care provider. Make sure you discuss any questions you have with your health care provider. Document Revised: 12/07/2021 Document Reviewed: 12/07/2021 Elsevier Patient Education  2024 [employer].

## 2023-11-19 NOTE — Assessment & Plan Note (Signed)
 Not currently experiencing any symptoms. Will continue to monitor closely. No changes at this time.

## 2023-11-19 NOTE — Progress Notes (Signed)
 Dell Fennel, DNP, AGNP-c [facility] [address] [place], [place] [address] Main Office [phone]  BP 118/72   Pulse 76   Ht 5' 3 (1.6 m)   Wt 117 lb 3.2 oz (53.2 kg)   LMP 11/15/2023   BMI 20.76 kg/m    Subjective:    Patient ID: Heather Woodard, female    DOB: 1976/02/13, 48 y.o.   MRN: [id number]  HPI: Heather Woodard is a 48 y.o. female presenting on 11/19/2023 for comprehensive medical examination.   History of Present Illness [name] is a 48 year old female who presents as a new patient for a physical exam. She has concerns with pain in her back.   She experiences intermittent pain in the middle of her back, below the bra strap, for approximately a year and a half. The pain is described as coming and going, without a specific activity that triggers it. It does not hurt to light touch but feels like it 'hurts in there' with pressure or movement. Occasionally, she feels a lump in the area but is unsure how to describe it. There is no correlation with physical activity, as the pain can occur while standing at a sink or not occur at all while mowing the yard. No specific factors exacerbate or alleviate the pain. She has not been sick or had any viruses when the pain first started.  She has a history of anxiety and has previously taken medication for it, though she does not recall the specifics. Chart review shows she was previously on hydroxyzine  10mg . Her anxiety is situational, with episodes of heightened stress rather than a constant state. She wants medication that can help manage these episodes without causing sedation. She mentions having seen a physician a few weeks ago for heart palpitations, which she attributes to stress. The palpitations have subsided and were not accompanied by any significant findings. She experiences occasional lightheadedness, particularly when moving too fast, which she associates with low blood  pressure. She has been told to increase sodium and water to aid in management.   She discusses experiencing symptoms of perimenopause, including irregular periods, difficulty regulating body temperature, and emotional changes such as unexpected crying.   She is physically active, frequently walking, and maintains a healthy lifestyle.   No fullness in her throat, difficulty swallowing, changes in hearing or vision, or changes to her bowel or bladder habits. No chest pain, shortness of breath, or palpitations today.   Pertinent items are noted in HPI.    Most Recent Depression Screen:     11/19/2023    1:49 PM 12/25/2021   10:37 AM 04/25/2020    9:33 AM 04/23/2019    9:29 AM 09/23/2017   10:19 AM  Depression screen PHQ 2/9  Decreased Interest 0 0 0 0 0  Down, Depressed, Hopeless 0 0 1 0 0  PHQ - 2 Score 0 0 1 0 0  Altered sleeping  0 0 0 0  Tired, decreased energy  0 0 0 0  Change in appetite  0 0 0 0  Feeling bad or failure about yourself   0 0 0 0  Trouble concentrating  0 0 0 0  Moving slowly or fidgety/restless  0 0 0 0  Suicidal thoughts  0 0 0 0  PHQ-9 Score  0 1 0 0  Difficult doing work/chores  Not difficult at all Somewhat difficult Not difficult at all Not difficult at all   Most Recent Anxiety Screen:  No data to display         Most Recent Fall Screen:    11/19/2023    1:49 PM 12/25/2021   10:36 AM 04/23/2019    9:28 AM 06/03/2013    8:40 AM  Fall Risk   Falls in the past year? 0 0 0  No   Number falls in past yr: 0 0 0    Injury with Fall? 0 0 0   Risk for fall due to : No Fall Risks No Fall Risks    Follow up Falls evaluation completed Falls evaluation completed  Falls evaluation completed       Data saved with a previous flowsheet row definition    Past medical history, surgical history, medications, allergies, family history and social history reviewed with patient today and changes made to appropriate areas of the chart.  Past Medical History:   Past Medical History:  Diagnosis Date  . Acute upper respiratory infection 10/06/2009   Qualifier: Diagnosis of   By: Paulla Bossier MD, Loralie Rocher SNOMED Dx Update Oct 2024    . Cardiac murmur 05/09/2020  . CHEST PAIN 12/30/2009   Qualifier: Diagnosis of  By: Paulla Bossier MD, Dana Duncan    . History of chicken pox   . History of skin cancer 10/27/2019   Formatting of this note might be different from the original. MIS on upper chest/supraclavicular, August 2019 [facility] on chest History of dysplatic nevi including one on the chest biopsied 2 months ago pending excision  . Migraines   . Neck pain on right side 06/03/2013  . Palpitations 05/09/2020  . Routine general medical examination at a health care facility 06/03/2013  . URI 10/06/2009   Qualifier: Diagnosis of  By: Paulla Bossier MD, Dana Duncan    . Varicose veins of bilateral lower extremities with other complications 02/15/2014   IMO SNOMED Dx Update Oct 2024    . Varicose veins of lower extremities with other complications 02/15/2014   Medications:  Current Outpatient Medications on File Prior to Visit  Medication Sig  . APPLE CIDER VINEGAR PO Take 1 tablet by mouth daily.  . Cholecalciferol (VITAMIN D3) 50 MCG (2000 UT) CAPS Take 2,000 Units by mouth daily.  . Collagen-Vitamin C-Biotin (COLLAGEN PO) Take 1 tablet by mouth daily.  . cyanocobalamin (VITAMIN B12) 500 MCG tablet Take 500 mcg by mouth daily.  . Multiple Vitamin (MULTIVITAMIN) tablet Take 1 tablet by mouth daily.  . Multiple Vitamins-Minerals (AIRBORNE PO) Take 1 packet by mouth daily.   No current facility-administered medications on file prior to visit.   Surgical History:  Past Surgical History:  Procedure Laterality Date  . AUGMENTATION MAMMAPLASTY    . BREAST BIOPSY Right 11/05/2017   BENIGN BREAST TISSUE WITH FIBROCYSTIC CHANGE,   Allergies:  Allergies  Allergen Reactions  . Sulfonamide Derivatives Rash   Family History:  Family History  Problem Relation Age of  Onset  . Cancer Father        Melanoma  . Emphysema Maternal Grandfather   . Alzheimer's disease Paternal Grandmother   . Varicose Veins Mother   . Varicose Veins Maternal Grandmother   . Rheum arthritis Paternal Aunt        Objective:    BP 118/72   Pulse 76   Ht 5' 3 (1.6 m)   Wt 117 lb 3.2 oz (53.2 kg)   LMP 11/15/2023   BMI 20.76 kg/m   Wt Readings from Last 3 Encounters:  11/19/23 117 lb  3.2 oz (53.2 kg)  11/01/23 116 lb (52.6 kg)  12/25/21 120 lb (54.4 kg)    Physical Exam Vitals and nursing note reviewed.  Constitutional:      General: She is not in acute distress.    Appearance: Normal appearance.  HENT:     Head: Normocephalic and atraumatic.     Right Ear: Hearing, tympanic membrane, ear canal and external ear normal.     Left Ear: Hearing, tympanic membrane, ear canal and external ear normal.     Nose: Nose normal.     Right Sinus: No maxillary sinus tenderness or frontal sinus tenderness.     Left Sinus: No maxillary sinus tenderness or frontal sinus tenderness.     Mouth/Throat:     Lips: Pink.     Mouth: Mucous membranes are moist.     Pharynx: Oropharynx is clear.   Eyes:     General: Lids are normal. Vision grossly intact.     Extraocular Movements: Extraocular movements intact.     Conjunctiva/sclera: Conjunctivae normal.     Pupils: Pupils are equal, round, and reactive to light.     Funduscopic exam:    Right eye: Red reflex present.        Left eye: Red reflex present.    Visual Fields: Right eye visual fields normal and left eye visual fields normal.   Neck:     Thyroid : No thyromegaly.     Vascular: No carotid bruit.   Cardiovascular:     Rate and Rhythm: Normal rate and regular rhythm.     Chest Wall: PMI is not displaced.     Pulses: Normal pulses.          Dorsalis pedis pulses are 2+ on the right side and 2+ on the left side.       Posterior tibial pulses are 2+ on the right side and 2+ on the left side.     Heart sounds: Murmur  heard.  Pulmonary:     Effort: Pulmonary effort is normal. No respiratory distress.     Breath sounds: Normal breath sounds.  Abdominal:     General: Abdomen is flat. Bowel sounds are normal. There is no distension.     Palpations: Abdomen is soft. There is no hepatomegaly, splenomegaly or mass.     Tenderness: There is no abdominal tenderness. There is no right CVA tenderness, left CVA tenderness, guarding or rebound.   Musculoskeletal:        General: Normal range of motion.     Cervical back: Full passive range of motion without pain, normal range of motion and neck supple. No tenderness.     Right lower leg: No edema.     Left lower leg: No edema.     Comments: Tenderness noted on the posterior aspect of the right chest wall at the 5th intercostal space.   Feet:     Left foot:     Toenail Condition: Left toenails are normal.  Lymphadenopathy:     Cervical: No cervical adenopathy.     Upper Body:     Right upper body: No supraclavicular adenopathy.     Left upper body: No supraclavicular adenopathy.   Skin:    General: Skin is warm and dry.     Capillary Refill: Capillary refill takes less than 2 seconds.     Nails: There is no clubbing.   Neurological:     General: No focal deficit present.     Mental Status: She  is alert and oriented to person, place, and time.     GCS: GCS eye subscore is 4. GCS verbal subscore is 5. GCS motor subscore is 6.     Sensory: Sensation is intact.     Motor: Motor function is intact.     Coordination: Coordination is intact.     Gait: Gait is intact.     Deep Tendon Reflexes: Reflexes are normal and symmetric.   Psychiatric:        Attention and Perception: Attention normal.        Mood and Affect: Mood normal.        Speech: Speech normal.        Behavior: Behavior normal. Behavior is cooperative.        Thought Content: Thought content normal.        Cognition and Memory: Cognition and memory normal.        Judgment: Judgment normal.      Results for orders placed or performed in visit on 12/25/21  Hepatitis C antibody   Collection Time: 12/25/21 11:55 AM  Result Value Ref Range   Hepatitis C Ab NON-REACTIVE NON-REACTIVE  CBC with Differential/Platelet   Collection Time: 12/25/21 11:55 AM  Result Value Ref Range   WBC 6.4 4.0 - 10.5 K/uL   RBC 4.37 3.87 - 5.11 Mil/uL   Hemoglobin 13.7 12.0 - 15.0 g/dL   HCT 16.1 09.6 - 04.5 %   MCV 94.9 78.0 - 100.0 fl   MCHC 33.1 30.0 - 36.0 g/dL   RDW 40.9 81.1 - 91.4 %   Platelets 199.0 150.0 - 400.0 K/uL   Neutrophils Relative % 57.9 43.0 - 77.0 %   Lymphocytes Relative 32.7 12.0 - 46.0 %   Monocytes Relative 7.0 3.0 - 12.0 %   Eosinophils Relative 1.7 0.0 - 5.0 %   Basophils Relative 0.7 0.0 - 3.0 %   Neutro Abs 3.7 1.4 - 7.7 K/uL   Lymphs Abs 2.1 0.7 - 4.0 K/uL   Monocytes Absolute 0.4 0.1 - 1.0 K/uL   Eosinophils Absolute 0.1 0.0 - 0.7 K/uL   Basophils Absolute 0.0 0.0 - 0.1 K/uL  Comprehensive metabolic panel   Collection Time: 12/25/21 11:55 AM  Result Value Ref Range   Sodium 137 135 - 145 mEq/L   Potassium 4.2 3.5 - 5.1 mEq/L   Chloride 105 96 - 112 mEq/L   CO2 24 19 - 32 mEq/L   Glucose, Bld 88 70 - 99 mg/dL   BUN 15 6 - 23 mg/dL   Creatinine, Ser 7.82 0.40 - 1.20 mg/dL   Total Bilirubin 0.7 0.2 - 1.2 mg/dL   Alkaline Phosphatase 55 39 - 117 U/L   AST 18 0 - 37 U/L   ALT 13 0 - 35 U/L   Total Protein 7.3 6.0 - 8.3 g/dL   Albumin 4.7 3.5 - 5.2 g/dL   GFR 956.21 >30.86 mL/min   Calcium 9.4 8.4 - 10.5 mg/dL  Hemoglobin V7Q   Collection Time: 12/25/21 11:55 AM  Result Value Ref Range   Hgb A1c MFr Bld 5.7 4.6 - 6.5 %  Lipid panel   Collection Time: 12/25/21 11:55 AM  Result Value Ref Range   Cholesterol 193 0 - 200 mg/dL   Triglycerides 46.9 0.0 - 149.0 mg/dL   HDL 62.95 >28.41 mg/dL   VLDL 9.6 0.0 - 32.4 mg/dL   LDL Cholesterol 401 (H) 0 - 99 mg/dL   Total CHOL/HDL Ratio 3    NonHDL 128.87  TSH   Collection Time: 12/25/21 11:55 AM  Result  Value Ref Range   TSH 0.53 0.35 - 5.50 uIU/mL       Assessment & Plan:   Problem List Items Addressed This Visit   None     Follow up plan: No follow-ups on file.  NEXT PREVENTATIVE PHYSICAL DUE IN 1 YEAR.  PATIENT COUNSELING PROVIDED FOR ALL ADULT PATIENTS: A well balanced diet low in saturated fats, cholesterol, and moderation in carbohydrates.  This can be as simple as monitoring portion sizes and cutting back on sugary beverages such as soda and juice to start with.    Daily water consumption of at least 64 ounces.  Physical activity at least 180 minutes per week.  If just starting out, start 10 minutes a day and work your way up.   This can be as simple as taking the stairs instead of the elevator and walking 2-3 laps around the office  purposefully every day.   STD protection, partner selection, and regular testing if high risk.  Limited consumption of alcoholic beverages if alcohol is consumed. For men, I recommend no more than 14 alcoholic beverages per week, spread out throughout the week (max 2 per day). Avoid binge drinking or consuming large quantities of alcohol in one setting.  Please let me know if you feel you may need help with reduction or quitting alcohol consumption.   Avoidance of nicotine, if used. Please let me know if you feel you may need help with reduction or quitting nicotine use.   Daily mental health attention. This can be in the form of 5 minute daily meditation, prayer, journaling, yoga, reflection, etc.  Purposeful attention to your emotions and mental state can significantly improve your overall wellbeing  and  Health.  Please know that I am here to help you with all of your health care goals and am happy to work with you to find a solution that works best for you.  The greatest advice I have received with any changes in life are to take it one step at a time, that even means if all you can focus on is the next 60 seconds, then do that and  celebrate your victories.  With any changes in life, you will have set backs, and that is OK. The important thing to remember is, if you have a set back, it is not a failure, it is an opportunity to try again! Screening Testing Mammogram Every 1 -2 years based on history and risk factors Starting at age 10 Pap Smear Ages 21-39 every 3 years Ages 49-65 every 5 years with HPV testing More frequent testing may be required based on results and history Colon Cancer Screening Every 1-10 years based on test performed, risk factors, and history Starting at age 45 Bone Density Screening Every 2-10 years based on history Starting at age 40 for women Recommendations for men differ based on medication usage, history, and risk factors AAA Screening One time ultrasound Men 36-53 years old who have every smoked Lung Cancer Screening Low Dose Lung CT every 12 months Age 68-80 years with a 30 pack-year smoking history who still smoke or who have quit within the last 15 years   Screening Labs Routine  Labs: Complete Blood Count (CBC), Complete Metabolic Panel (CMP), Cholesterol (Lipid Panel) Every 6-12 months based on history and medications May be recommended more frequently based on current conditions or previous results Hemoglobin A1c Lab Every 3-12 months based on history and  previous results Starting at age 32 or earlier with diagnosis of diabetes, high cholesterol, BMI >26, and/or risk factors Frequent monitoring for patients with diabetes to ensure blood sugar control Thyroid  Panel (TSH) Every 6 months based on history, symptoms, and risk factors May be repeated more often if on medication HIV One time testing for all patients 67 and older May be repeated more frequently for patients with increased risk factors or exposure Hepatitis C One time testing for all patients 52 and older May be repeated more frequently for patients with increased risk factors or exposure Gonorrhea,  Chlamydia Every 12 months for all sexually active persons 13-24 years Additional monitoring may be recommended for those who are considered high risk or who have symptoms Every 12 months for any woman on birth control, regardless of sexual activity PSA Men 17-4 years old with risk factors Additional screening may be recommended from age 27-69 based on risk factors, symptoms, and history  Vaccine Recommendations Tetanus Booster All adults every 10 years Flu Vaccine All patients 6 months and older every year COVID Vaccine All patients 12 years and older Initial dosing with booster May recommend additional booster based on age and health history HPV Vaccine 2 doses all patients age 40-26 Dosing may be considered for patients over 26 Shingles Vaccine (Shingrix) 2 doses all adults 55 years and older Pneumonia (Pneumovax 65) All adults 65 years and older May recommend earlier dosing based on health history One year apart from Prevnar 54 Pneumonia (Prevnar 59) All adults 65 years and older Dosed 1 year after Pneumovax 23 Pneumonia (Prevnar 20) One time alternative to the two dosing of 13 and 23 For all adults with initial dose of 23, 20 is recommended 1 year later For all adults with initial dose of 13, 23 is still recommended as second option 1 year later

## 2023-11-19 NOTE — Assessment & Plan Note (Addendum)
 Symptoms consistent with perimenopause, including irregular periods, temperature regulation issues, and emotional changes. Discussed natural progression of perimenopause and menopause. Explained that symptoms are a natural part of the transition and typically improve after menopause. - Discuss lifestyle modifications and the natural course of perimenopause.

## 2023-11-20 LAB — CBC WITH DIFFERENTIAL/PLATELET
Basophils Absolute: 0.1 10*3/uL (ref 0.0–0.2)
Basos: 1 %
EOS (ABSOLUTE): 0.1 10*3/uL (ref 0.0–0.4)
Eos: 2 %
Hematocrit: 41.8 % (ref 34.0–46.6)
Hemoglobin: 13.5 g/dL (ref 11.1–15.9)
Immature Grans (Abs): 0 10*3/uL (ref 0.0–0.1)
Immature Granulocytes: 0 %
Lymphocytes Absolute: 2.3 10*3/uL (ref 0.7–3.1)
Lymphs: 42 %
MCH: 31.2 pg (ref 26.6–33.0)
MCHC: 32.3 g/dL (ref 31.5–35.7)
MCV: 97 fL (ref 79–97)
Monocytes Absolute: 0.3 10*3/uL (ref 0.1–0.9)
Monocytes: 6 %
Neutrophils Absolute: 2.7 10*3/uL (ref 1.4–7.0)
Neutrophils: 49 %
Platelets: 258 10*3/uL (ref 150–450)
RBC: 4.33 x10E6/uL (ref 3.77–5.28)
RDW: 12.1 % (ref 11.7–15.4)
WBC: 5.5 10*3/uL (ref 3.4–10.8)

## 2023-11-20 LAB — CMP14+EGFR
ALT: 11 IU/L (ref 0–32)
AST: 18 IU/L (ref 0–40)
Albumin: 4.4 g/dL (ref 3.9–4.9)
Alkaline Phosphatase: 81 IU/L (ref 44–121)
BUN/Creatinine Ratio: 17 (ref 9–23)
BUN: 12 mg/dL (ref 6–24)
Bilirubin Total: 0.5 mg/dL (ref 0.0–1.2)
CO2: 19 mmol/L — ABNORMAL LOW (ref 20–29)
Calcium: 9.2 mg/dL (ref 8.7–10.2)
Chloride: 103 mmol/L (ref 96–106)
Creatinine, Ser: 0.69 mg/dL (ref 0.57–1.00)
Globulin, Total: 2.8 g/dL (ref 1.5–4.5)
Glucose: 81 mg/dL (ref 70–99)
Potassium: 4.1 mmol/L (ref 3.5–5.2)
Sodium: 137 mmol/L (ref 134–144)
Total Protein: 7.2 g/dL (ref 6.0–8.5)
eGFR: 108 mL/min/{1.73_m2} (ref >59)

## 2023-11-20 LAB — LP+LDL DIRECT
Cholesterol, Total: 204 mg/dL — ABNORMAL HIGH (ref 100–199)
HDL: 67 mg/dL (ref >39)
LDL Chol Calc (NIH): 123 mg/dL — ABNORMAL HIGH (ref 0–99)
LDL Direct: 128 mg/dL — ABNORMAL HIGH (ref 0–99)
Triglycerides: 80 mg/dL (ref 0–149)
VLDL Cholesterol Cal: 14 mg/dL (ref 5–40)

## 2023-11-20 LAB — TSH: TSH: 0.85 u[IU]/mL (ref 0.450–4.500)

## 2023-11-26 ENCOUNTER — Ambulatory Visit: Payer: Self-pay | Admitting: Nurse Practitioner

## 2023-11-26 DIAGNOSIS — E7800 Pure hypercholesterolemia, unspecified: Secondary | ICD-10-CM | POA: Insufficient documentation

## 2023-12-03 DIAGNOSIS — Z1211 Encounter for screening for malignant neoplasm of colon: Secondary | ICD-10-CM | POA: Diagnosis not present

## 2023-12-11 LAB — COLOGUARD: COLOGUARD: NEGATIVE

## 2024-01-13 DIAGNOSIS — L821 Other seborrheic keratosis: Secondary | ICD-10-CM | POA: Diagnosis not present

## 2024-01-23 DIAGNOSIS — R102 Pelvic and perineal pain: Secondary | ICD-10-CM | POA: Diagnosis not present

## 2024-01-23 DIAGNOSIS — N852 Hypertrophy of uterus: Secondary | ICD-10-CM | POA: Diagnosis not present

## 2024-01-31 ENCOUNTER — Other Ambulatory Visit: Payer: Self-pay | Admitting: Gynecology

## 2024-01-31 DIAGNOSIS — Z1231 Encounter for screening mammogram for malignant neoplasm of breast: Secondary | ICD-10-CM

## 2024-03-10 ENCOUNTER — Other Ambulatory Visit: Payer: Self-pay | Admitting: Medical Genetics

## 2024-03-16 ENCOUNTER — Ambulatory Visit
Admission: RE | Admit: 2024-03-16 | Discharge: 2024-03-16 | Disposition: A | Discharge status: Home / Self Care | Source: Ambulatory Visit | Attending: Gynecology | Admitting: Gynecology

## 2024-03-16 DIAGNOSIS — Z1231 Encounter for screening mammogram for malignant neoplasm of breast: Secondary | ICD-10-CM

## 2024-04-16 ENCOUNTER — Other Ambulatory Visit: Payer: Self-pay

## 2024-11-27 ENCOUNTER — Encounter: Payer: Self-pay | Admitting: Nurse Practitioner
